# Patient Record
Sex: Female | Born: 1935 | Race: Black or African American | Hispanic: No | Marital: Single | Smoking: Never smoker
Health system: Southern US, Community
[De-identification: ages and names within clinical notes are randomized; demographics above are authoritative.]

## PROBLEM LIST (undated history)

## (undated) DIAGNOSIS — G40909 Epilepsy, unspecified, not intractable, without status epilepticus: Secondary | ICD-10-CM

## (undated) DIAGNOSIS — Z9221 Personal history of antineoplastic chemotherapy: Secondary | ICD-10-CM

## (undated) DIAGNOSIS — Z5181 Encounter for therapeutic drug level monitoring: Secondary | ICD-10-CM

## (undated) DIAGNOSIS — M1990 Unspecified osteoarthritis, unspecified site: Secondary | ICD-10-CM

## (undated) DIAGNOSIS — C50919 Malignant neoplasm of unspecified site of unspecified female breast: Secondary | ICD-10-CM

## (undated) DIAGNOSIS — Z923 Personal history of irradiation: Secondary | ICD-10-CM

## (undated) DIAGNOSIS — M8580 Other specified disorders of bone density and structure, unspecified site: Secondary | ICD-10-CM

## (undated) HISTORY — DX: Malignant neoplasm of unspecified site of unspecified female breast: C50.919

## (undated) HISTORY — DX: Unspecified osteoarthritis, unspecified site: M19.90

## (undated) HISTORY — DX: Encounter for therapeutic drug level monitoring: Z51.81

## (undated) HISTORY — DX: Epilepsy, unspecified, not intractable, without status epilepticus: G40.909

## (undated) HISTORY — DX: Other specified disorders of bone density and structure, unspecified site: M85.80

## (undated) HISTORY — PX: BREAST BIOPSY: SHX20

---

## 1998-10-28 DIAGNOSIS — C50919 Malignant neoplasm of unspecified site of unspecified female breast: Secondary | ICD-10-CM

## 1998-10-28 DIAGNOSIS — Z923 Personal history of irradiation: Secondary | ICD-10-CM

## 1998-10-28 DIAGNOSIS — Z9221 Personal history of antineoplastic chemotherapy: Secondary | ICD-10-CM

## 1998-10-28 HISTORY — DX: Personal history of antineoplastic chemotherapy: Z92.21

## 1998-10-28 HISTORY — DX: Personal history of irradiation: Z92.3

## 1998-10-28 HISTORY — DX: Malignant neoplasm of unspecified site of unspecified female breast: C50.919

## 1999-06-26 ENCOUNTER — Ambulatory Visit (HOSPITAL_COMMUNITY): Admission: RE | Admit: 1999-06-26 | Discharge: 1999-06-26 | Payer: Self-pay | Admitting: Obstetrics and Gynecology

## 1999-06-26 ENCOUNTER — Encounter: Payer: Self-pay | Admitting: Obstetrics and Gynecology

## 1999-07-03 ENCOUNTER — Encounter: Payer: Self-pay | Admitting: Obstetrics and Gynecology

## 1999-07-03 ENCOUNTER — Ambulatory Visit (HOSPITAL_COMMUNITY): Admission: RE | Admit: 1999-07-03 | Discharge: 1999-07-03 | Payer: Self-pay | Admitting: Obstetrics and Gynecology

## 1999-07-04 ENCOUNTER — Ambulatory Visit (HOSPITAL_COMMUNITY): Admission: RE | Admit: 1999-07-04 | Discharge: 1999-07-04 | Payer: Self-pay | Admitting: Obstetrics and Gynecology

## 1999-07-04 ENCOUNTER — Encounter: Payer: Self-pay | Admitting: Obstetrics and Gynecology

## 1999-07-04 HISTORY — PX: BREAST BIOPSY: SHX20

## 1999-07-25 ENCOUNTER — Encounter: Payer: Self-pay | Admitting: General Surgery

## 1999-07-25 ENCOUNTER — Ambulatory Visit (HOSPITAL_BASED_OUTPATIENT_CLINIC_OR_DEPARTMENT_OTHER): Admission: RE | Admit: 1999-07-25 | Discharge: 1999-07-25 | Payer: Self-pay | Admitting: General Surgery

## 1999-07-25 HISTORY — PX: BREAST LUMPECTOMY: SHX2

## 1999-08-06 ENCOUNTER — Encounter: Payer: Self-pay | Admitting: General Surgery

## 1999-08-07 ENCOUNTER — Ambulatory Visit (HOSPITAL_COMMUNITY): Admission: RE | Admit: 1999-08-07 | Discharge: 1999-08-07 | Payer: Self-pay | Admitting: General Surgery

## 1999-08-07 ENCOUNTER — Encounter: Payer: Self-pay | Admitting: General Surgery

## 1999-09-05 ENCOUNTER — Encounter: Payer: Self-pay | Admitting: General Surgery

## 1999-09-05 ENCOUNTER — Ambulatory Visit (HOSPITAL_COMMUNITY): Admission: RE | Admit: 1999-09-05 | Discharge: 1999-09-05 | Payer: Self-pay | Admitting: General Surgery

## 1999-11-14 ENCOUNTER — Ambulatory Visit (HOSPITAL_BASED_OUTPATIENT_CLINIC_OR_DEPARTMENT_OTHER): Admission: RE | Admit: 1999-11-14 | Discharge: 1999-11-14 | Payer: Self-pay | Admitting: General Surgery

## 1999-12-17 ENCOUNTER — Encounter: Admission: RE | Admit: 1999-12-17 | Discharge: 2000-03-16 | Payer: Self-pay | Admitting: Radiation Oncology

## 2000-08-30 ENCOUNTER — Emergency Department (HOSPITAL_COMMUNITY): Admission: EM | Admit: 2000-08-30 | Discharge: 2000-08-30 | Payer: Self-pay | Admitting: Emergency Medicine

## 2000-10-14 ENCOUNTER — Encounter: Admission: RE | Admit: 2000-10-14 | Discharge: 2000-10-14 | Payer: Self-pay | Admitting: General Surgery

## 2000-10-14 ENCOUNTER — Encounter: Payer: Self-pay | Admitting: General Surgery

## 2000-12-12 ENCOUNTER — Encounter: Admission: RE | Admit: 2000-12-12 | Discharge: 2001-03-12 | Payer: Self-pay | Admitting: Dentistry

## 2001-09-03 ENCOUNTER — Encounter (HOSPITAL_COMMUNITY): Admission: RE | Admit: 2001-09-03 | Discharge: 2001-12-02 | Payer: Self-pay | Admitting: Dentistry

## 2001-10-13 ENCOUNTER — Encounter: Admission: RE | Admit: 2001-10-13 | Discharge: 2001-10-13 | Payer: Self-pay | Admitting: Family Medicine

## 2001-10-13 ENCOUNTER — Encounter: Payer: Self-pay | Admitting: Family Medicine

## 2001-10-16 ENCOUNTER — Encounter: Payer: Self-pay | Admitting: General Surgery

## 2001-10-16 ENCOUNTER — Encounter: Admission: RE | Admit: 2001-10-16 | Discharge: 2001-10-16 | Payer: Self-pay | Admitting: General Surgery

## 2002-03-04 ENCOUNTER — Encounter (HOSPITAL_COMMUNITY): Admission: RE | Admit: 2002-03-04 | Discharge: 2002-06-02 | Payer: Self-pay | Admitting: Dentistry

## 2002-03-16 ENCOUNTER — Encounter: Payer: Self-pay | Admitting: Family Medicine

## 2002-03-16 ENCOUNTER — Encounter: Admission: RE | Admit: 2002-03-16 | Discharge: 2002-03-16 | Payer: Self-pay | Admitting: Family Medicine

## 2002-04-02 ENCOUNTER — Encounter: Payer: Self-pay | Admitting: Family Medicine

## 2002-04-02 ENCOUNTER — Encounter: Admission: RE | Admit: 2002-04-02 | Discharge: 2002-04-02 | Payer: Self-pay | Admitting: Family Medicine

## 2002-10-19 ENCOUNTER — Encounter: Payer: Self-pay | Admitting: General Surgery

## 2002-10-19 ENCOUNTER — Encounter: Admission: RE | Admit: 2002-10-19 | Discharge: 2002-10-19 | Payer: Self-pay | Admitting: General Surgery

## 2003-10-25 ENCOUNTER — Encounter: Admission: RE | Admit: 2003-10-25 | Discharge: 2003-10-25 | Payer: Self-pay | Admitting: Hematology & Oncology

## 2004-04-30 ENCOUNTER — Emergency Department (HOSPITAL_COMMUNITY): Admission: EM | Admit: 2004-04-30 | Discharge: 2004-04-30 | Payer: Self-pay | Admitting: Emergency Medicine

## 2004-10-04 ENCOUNTER — Ambulatory Visit: Payer: Self-pay | Admitting: Hematology & Oncology

## 2004-10-25 ENCOUNTER — Encounter: Admission: RE | Admit: 2004-10-25 | Discharge: 2004-10-25 | Payer: Self-pay | Admitting: General Surgery

## 2004-11-26 ENCOUNTER — Ambulatory Visit (HOSPITAL_COMMUNITY): Admission: RE | Admit: 2004-11-26 | Discharge: 2004-11-26 | Payer: Self-pay | Admitting: Gastroenterology

## 2004-12-03 ENCOUNTER — Encounter: Admission: RE | Admit: 2004-12-03 | Discharge: 2004-12-03 | Payer: Self-pay | Admitting: Dentistry

## 2004-12-03 ENCOUNTER — Ambulatory Visit: Payer: Self-pay | Admitting: Dentistry

## 2004-12-10 ENCOUNTER — Encounter: Admission: RE | Admit: 2004-12-10 | Discharge: 2004-12-10 | Payer: Self-pay | Admitting: Dentistry

## 2004-12-12 ENCOUNTER — Ambulatory Visit: Payer: Self-pay | Admitting: Dentistry

## 2005-08-13 ENCOUNTER — Encounter: Admission: RE | Admit: 2005-08-13 | Discharge: 2005-08-13 | Payer: Self-pay | Admitting: Neurology

## 2005-10-03 ENCOUNTER — Ambulatory Visit: Payer: Self-pay | Admitting: Hematology & Oncology

## 2005-11-04 ENCOUNTER — Encounter: Admission: RE | Admit: 2005-11-04 | Discharge: 2005-11-04 | Payer: Self-pay | Admitting: Hematology & Oncology

## 2006-09-29 ENCOUNTER — Ambulatory Visit: Payer: Self-pay | Admitting: Dentistry

## 2006-09-30 ENCOUNTER — Ambulatory Visit: Payer: Self-pay | Admitting: Hematology & Oncology

## 2006-10-03 LAB — CBC WITH DIFFERENTIAL/PLATELET
Basophils Absolute: 0 10*3/uL (ref 0.0–0.1)
Eosinophils Absolute: 0.2 10*3/uL (ref 0.0–0.5)
HGB: 13.7 g/dL (ref 11.6–15.9)
MONO#: 0.3 10*3/uL (ref 0.1–0.9)
NEUT#: 5.1 10*3/uL (ref 1.5–6.5)
RDW: 12.8 % (ref 11.3–14.5)
WBC: 7.2 10*3/uL (ref 3.9–10.0)
lymph#: 1.6 10*3/uL (ref 0.9–3.3)

## 2006-10-03 LAB — COMPREHENSIVE METABOLIC PANEL
Albumin: 3.9 g/dL (ref 3.5–5.2)
BUN: 11 mg/dL (ref 6–23)
Calcium: 8.9 mg/dL (ref 8.4–10.5)
Chloride: 104 mEq/L (ref 96–112)
Glucose, Bld: 164 mg/dL — ABNORMAL HIGH (ref 70–99)
Potassium: 3.9 mEq/L (ref 3.5–5.3)
Total Protein: 6.9 g/dL (ref 6.0–8.3)

## 2006-12-09 ENCOUNTER — Encounter: Admission: RE | Admit: 2006-12-09 | Discharge: 2006-12-09 | Payer: Self-pay | Admitting: Hematology & Oncology

## 2007-09-30 ENCOUNTER — Ambulatory Visit: Payer: Self-pay | Admitting: Hematology & Oncology

## 2007-10-28 LAB — CBC WITH DIFFERENTIAL/PLATELET
BASO%: 0.7 % (ref 0.0–2.0)
Basophils Absolute: 0.1 10*3/uL (ref 0.0–0.1)
EOS%: 2.5 % (ref 0.0–7.0)
Eosinophils Absolute: 0.2 10*3/uL (ref 0.0–0.5)
HCT: 39 % (ref 34.8–46.6)
HGB: 13.4 g/dL (ref 11.6–15.9)
LYMPH%: 24.3 % (ref 14.0–48.0)
MCH: 29.7 pg (ref 26.0–34.0)
MCHC: 34.4 g/dL (ref 32.0–36.0)
MCV: 86.4 fL (ref 81.0–101.0)
MONO#: 0.4 10*3/uL (ref 0.1–0.9)
MONO%: 4.9 % (ref 0.0–13.0)
NEUT#: 5.1 10*3/uL (ref 1.5–6.5)
NEUT%: 67.6 % (ref 39.6–76.8)
Platelets: 279 10*3/uL (ref 145–400)
RBC: 4.51 10*6/uL (ref 3.70–5.32)
RDW: 12.9 % (ref 11.3–14.5)
WBC: 7.5 10*3/uL (ref 3.9–10.0)
lymph#: 1.8 10*3/uL (ref 0.9–3.3)

## 2007-10-28 LAB — COMPREHENSIVE METABOLIC PANEL
ALT: 18 U/L (ref 0–35)
AST: 18 U/L (ref 0–37)
Albumin: 3.9 g/dL (ref 3.5–5.2)
Alkaline Phosphatase: 66 U/L (ref 39–117)
BUN: 12 mg/dL (ref 6–23)
CO2: 25 mEq/L (ref 19–32)
Calcium: 9.2 mg/dL (ref 8.4–10.5)
Chloride: 107 mEq/L (ref 96–112)
Creatinine, Ser: 0.78 mg/dL (ref 0.40–1.20)
Glucose, Bld: 115 mg/dL — ABNORMAL HIGH (ref 70–99)
Potassium: 4 mEq/L (ref 3.5–5.3)
Sodium: 144 mEq/L (ref 135–145)
Total Bilirubin: 0.2 mg/dL — ABNORMAL LOW (ref 0.3–1.2)
Total Protein: 7.1 g/dL (ref 6.0–8.3)

## 2007-12-11 ENCOUNTER — Encounter: Admission: RE | Admit: 2007-12-11 | Discharge: 2007-12-11 | Payer: Self-pay | Admitting: Obstetrics and Gynecology

## 2008-10-20 ENCOUNTER — Ambulatory Visit: Payer: Self-pay | Admitting: Hematology & Oncology

## 2008-10-24 LAB — CBC WITH DIFFERENTIAL (CANCER CENTER ONLY)
BASO%: 0.5 % (ref 0.0–2.0)
EOS%: 3.1 % (ref 0.0–7.0)
LYMPH%: 20.6 % (ref 14.0–48.0)
MCH: 29.6 pg (ref 26.0–34.0)
MCHC: 34 g/dL (ref 32.0–36.0)
MCV: 87 fL (ref 81–101)
MONO%: 6.9 % (ref 0.0–13.0)
NEUT#: 4.4 10*3/uL (ref 1.5–6.5)
Platelets: 248 10*3/uL (ref 145–400)
RBC: 4.55 10*6/uL (ref 3.70–5.32)
RDW: 11.6 % (ref 10.5–14.6)

## 2008-10-24 LAB — COMPREHENSIVE METABOLIC PANEL
Albumin: 3.9 g/dL (ref 3.5–5.2)
Alkaline Phosphatase: 75 U/L (ref 39–117)
BUN: 11 mg/dL (ref 6–23)
CO2: 28 mEq/L (ref 19–32)
Calcium: 8.7 mg/dL (ref 8.4–10.5)
Sodium: 140 mEq/L (ref 135–145)

## 2008-12-12 ENCOUNTER — Encounter: Admission: RE | Admit: 2008-12-12 | Discharge: 2008-12-12 | Payer: Self-pay | Admitting: Hematology & Oncology

## 2009-10-24 ENCOUNTER — Ambulatory Visit: Payer: Self-pay | Admitting: Hematology & Oncology

## 2009-10-24 ENCOUNTER — Encounter: Admission: RE | Admit: 2009-10-24 | Discharge: 2009-10-24 | Payer: Self-pay | Admitting: Neurology

## 2009-10-28 DIAGNOSIS — M8580 Other specified disorders of bone density and structure, unspecified site: Secondary | ICD-10-CM

## 2009-10-28 HISTORY — DX: Other specified disorders of bone density and structure, unspecified site: M85.80

## 2009-11-09 ENCOUNTER — Encounter: Admission: RE | Admit: 2009-11-09 | Discharge: 2009-11-09 | Payer: Self-pay | Admitting: Neurology

## 2009-12-21 ENCOUNTER — Encounter: Admission: RE | Admit: 2009-12-21 | Discharge: 2009-12-21 | Payer: Self-pay | Admitting: Hematology & Oncology

## 2010-10-24 ENCOUNTER — Ambulatory Visit: Payer: Self-pay | Admitting: Hematology & Oncology

## 2010-11-17 ENCOUNTER — Other Ambulatory Visit: Payer: Self-pay | Admitting: Hematology & Oncology

## 2010-11-17 DIAGNOSIS — Z1239 Encounter for other screening for malignant neoplasm of breast: Secondary | ICD-10-CM

## 2010-12-24 ENCOUNTER — Ambulatory Visit: Payer: Self-pay

## 2010-12-25 ENCOUNTER — Ambulatory Visit
Admission: RE | Admit: 2010-12-25 | Discharge: 2010-12-25 | Disposition: A | Discharge status: Home / Self Care | Payer: Medicare Other | Source: Ambulatory Visit | Attending: Hematology & Oncology | Admitting: Hematology & Oncology

## 2010-12-25 DIAGNOSIS — Z1239 Encounter for other screening for malignant neoplasm of breast: Secondary | ICD-10-CM

## 2010-12-26 ENCOUNTER — Ambulatory Visit: Payer: Self-pay

## 2010-12-28 ENCOUNTER — Other Ambulatory Visit: Payer: Self-pay | Admitting: Hematology & Oncology

## 2010-12-28 DIAGNOSIS — R928 Other abnormal and inconclusive findings on diagnostic imaging of breast: Secondary | ICD-10-CM

## 2011-01-03 ENCOUNTER — Ambulatory Visit
Admission: RE | Admit: 2011-01-03 | Discharge: 2011-01-03 | Disposition: A | Discharge status: Home / Self Care | Payer: Medicare Other | Source: Ambulatory Visit | Attending: Hematology & Oncology | Admitting: Hematology & Oncology

## 2011-01-03 DIAGNOSIS — R928 Other abnormal and inconclusive findings on diagnostic imaging of breast: Secondary | ICD-10-CM

## 2011-03-15 NOTE — Op Note (Signed)
Brandi [name], Brandi [name]                 ACCOUNT NO.:  [id number]   MEDICAL RECORD NO.:  [id number]          PATIENT TYPE:  AMB   LOCATION:  ENDO                         FACILITY:  [facility]   PHYSICIAN:  Petra Kuba, M.D.    DATE OF BIRTH:  [birth year]   DATE OF PROCEDURE:  11/26/2004  DATE OF DISCHARGE:                                 OPERATIVE REPORT   PROCEDURE:  Colonoscopy.   INDICATIONS:  History of breast cancer, due for colonic screening.  Consent  was signed after risks, benefits, methods and options were thoroughly  discussed in the office.   MEDICATIONS USED:  1.  Phenergan 25  mg .  2.  Versed 5 mg.   DESCRIPTION OF PROCEDURE:  Rectal inspection is pertinent for small external  hemorrhoids.  Digital examination was negative.  Video pediatric adjustable  colonoscope was inserted and with rolling her onto her back and abdominal  pressure we were able to advance to the cecum.  On insertion a mid  transverse, small lipoma was seen; it was not biopsied based on the classic  appearance. Had a nice yellow hue, with no overlying mucosa.  The cecum was  identified by the appendiceal orifice and the ileocecal valve.  There was  some vegetable matter in the right side of the colon that could be washed in  different parts, but could not be sectioned.  The rest of the prep was  adequate.  There was some washing and suctioning needed for liquid stool on  slow withdrawal through the colon.  Other than some rare left-sided  diverticula, no other abnormalities were seen.  Anorectal pullthrough in  retroflexion confirmed some small hemorrhoids.  The scope was straightened  and readvanced a short way up the left side of the colon.  Air was suctioned  and scope removed.  The patient tolerated the procedure well; there was no  obvious immediate complication.   ENDOSCOPIC DIAGNOSES:  1.  Internal/external small hemorrhoids.  2.  Rare left-sided diverticula.  3.  Small mid-transverse lipoma;  plus appearance not biopsied.  4.  Otherwise within normal limits to the cecum.   PLAN:  Happy to see back p.r.n.  Probably yearly rectals and guaiacs per  gynecology, oncology or primary care physician.  Otherwise, repeat colon  screening in five years.      MEM/MEDQ  D:  11/26/2004  T:  11/26/2004  Job:  [id number]   cc:   Lorne Skeens. Hoxworth, M.D.  [address]  [place]  [place] [address]

## 2011-11-01 ENCOUNTER — Other Ambulatory Visit: Payer: Self-pay | Admitting: Hematology & Oncology

## 2011-11-01 ENCOUNTER — Ambulatory Visit: Payer: Medicare Other | Admitting: Family

## 2011-11-01 ENCOUNTER — Other Ambulatory Visit: Payer: Medicare Other | Admitting: Lab

## 2011-11-01 ENCOUNTER — Ambulatory Visit (HOSPITAL_BASED_OUTPATIENT_CLINIC_OR_DEPARTMENT_OTHER): Payer: Medicare Other | Admitting: Hematology & Oncology

## 2011-11-01 ENCOUNTER — Other Ambulatory Visit (HOSPITAL_BASED_OUTPATIENT_CLINIC_OR_DEPARTMENT_OTHER): Payer: Medicare Other | Admitting: Lab

## 2011-11-01 DIAGNOSIS — Z853 Personal history of malignant neoplasm of breast: Secondary | ICD-10-CM

## 2011-11-01 DIAGNOSIS — M810 Age-related osteoporosis without current pathological fracture: Secondary | ICD-10-CM

## 2011-11-01 DIAGNOSIS — C50919 Malignant neoplasm of unspecified site of unspecified female breast: Secondary | ICD-10-CM

## 2011-11-01 NOTE — Progress Notes (Signed)
This office note has been dictated.

## 2011-11-02 LAB — VITAMIN D 25 HYDROXY (VIT D DEFICIENCY, FRACTURES): Vit D, 25-Hydroxy: 44 ng/mL (ref 30–89)

## 2011-11-04 NOTE — Progress Notes (Signed)
CC:   Katherine Roan, M.D. [facility] Genene Churn. Love, M.D.  DIAGNOSIS:  Stage IIA (T1 N1 M0) ductal carcinoma of the left breast.  CURRENT THERAPY:  Observation.  INTERIM HISTORY:  Ms. Mealy comes in for followup.  We see her yearly. Otherwise, she is full of stories.  She is doing well.  She has had no problems since we last saw her.  She had her last mammogram back in March of 2012.  The mammogram came out okay with no evidence of any suspicion of calcifications.  She has had no cough.  There has been no bony pain.  She has had no menopausal symptoms.  Her appetite is good.  She has had no change in bowel or bladder habits.  She has had no problems with headache.  There is no double vision or blurred vision.  PHYSICAL EXAMINATION:  This is a well-developed, well-nourished white female in no obvious distress.  Vital signs show a temperature of 97.4, pulse 77, respiratory rate 18, blood pressure 96/52.  Weight is 134. Head and neck:  Shows a normocephalic, atraumatic skull.  There are no ocular or oral lesions.  There are no palpable cervical or supraclavicular lymph nodes.  Lungs:  Clear bilaterally.  Cardiac: Regular rate and rhythm with a normal S1 and S2.  No murmurs, rubs or bruits.  Abdomen:  Soft with good bowel sounds.  There is no palpable abdominal mass.  There is no fluid wave.  There is no palpable hepatosplenomegaly.  Breasts:  Right breast with no masses, edema or erythema.  There is no right breast mass.  There is no right axillary adenopathy.  Left breast shows a well-healed lumpectomy scar at the 9 o'clock.  There may be some slight contraction of the left breast.  She has no obvious mass in the left breast.  There is no left axillary adenopathy.  Back:  No tenderness of the spine, ribs, or hips.  There is no kyphosis or osteoporotic changes.  Extremities:  No clubbing, cyanosis or edema.  Neurologic:  No focal neurological deficits.  Skin: No  rashes, ecchymoses or petechia.  LABORATORY STUDIES:  Not done this visit.  IMPRESSION:  Ms. Profitt is a 76 year old African American female with a history of stage IIA (T1 N1 M0) ductal carcinoma of the left breast. She underwent a lumpectomy.  She had adjuvant chemotherapy with 4 of epirubicin/Cytoxan.  She had ONE positive lymph node.  She did have radiation therapy.  Her tumor was ESTROGEN RECEPTOR/PROGESTERONE RECEPTOR-POSITIVE.  She was on tamoxifen.  Again, I see no evidence of recurrence to date.  We will plan to get her back in 1 year.    ______________________________ Josph Macho, M.D. PRE/MEDQ  D:  11/01/2011  T:  11/01/2011  Job:  [id number]

## 2012-01-13 ENCOUNTER — Other Ambulatory Visit: Payer: Self-pay | Admitting: Family Medicine

## 2012-01-13 DIAGNOSIS — Z1231 Encounter for screening mammogram for malignant neoplasm of breast: Secondary | ICD-10-CM

## 2012-01-20 ENCOUNTER — Ambulatory Visit
Admission: RE | Admit: 2012-01-20 | Discharge: 2012-01-20 | Disposition: A | Discharge status: Home / Self Care | Payer: Medicare Other | Source: Ambulatory Visit | Attending: Family Medicine | Admitting: Family Medicine

## 2012-01-20 DIAGNOSIS — Z1231 Encounter for screening mammogram for malignant neoplasm of breast: Secondary | ICD-10-CM

## 2012-04-07 ENCOUNTER — Emergency Department (HOSPITAL_COMMUNITY)
Admission: EM | Admit: 2012-04-07 | Discharge: 2012-04-07 | Disposition: A | Discharge status: Home Health | Payer: Medicare Other | Attending: Emergency Medicine | Admitting: Emergency Medicine

## 2012-04-07 ENCOUNTER — Emergency Department (HOSPITAL_COMMUNITY): Payer: Medicare Other

## 2012-04-07 ENCOUNTER — Encounter (HOSPITAL_COMMUNITY): Payer: Self-pay | Admitting: Emergency Medicine

## 2012-04-07 DIAGNOSIS — M1710 Unilateral primary osteoarthritis, unspecified knee: Secondary | ICD-10-CM | POA: Insufficient documentation

## 2012-04-07 DIAGNOSIS — M25569 Pain in unspecified knee: Secondary | ICD-10-CM

## 2012-04-07 MED ORDER — IBUPROFEN 200 MG PO TABS
400.0000 mg | ORAL_TABLET | Freq: Once | ORAL | Status: AC
  Administered 2012-04-07: 400 mg via ORAL
  Filled 2012-04-07: qty 2

## 2012-04-07 NOTE — ED Provider Notes (Signed)
History    This chart was scribed for Brandi Roots, MD, MD by Smitty Pluck. The patient was seen in room STRE4 and the patient's care was started at 11:11AM.   CSN: [id number]  Arrival date & time 04/07/12  1017   First MD Initiated Contact with Patient 04/07/12 1107      Chief Complaint  Patient presents with  . Knee Pain    (Consider location/radiation/quality/duration/timing/severity/associated sxs/prior treatment) HPI Brandi [name] is a 76 y.o. female who presents to the Emergency Department complaining of moderate knee pain onset 1 week ago after fall on cement. States tripped/mechanical fall, no faintness or dizziness. . Pt reports having more pain in right knee. The left knee pain has resolved.  She states that there was bruising and swelling. The pain has been constant since onset without radiation. Denies numbness and weakness in the leg. Walking aggravates the pain. Denies LOC and hitting head during fall.  Denies other injury. No calf pain or swelling. No numbness/weakness. No neck or back pain.    Past Medical History  Diagnosis Date  . Seizures     History reviewed. No pertinent past surgical history.  History reviewed. No pertinent family history.  History  Substance Use Topics  . Smoking status: Never Smoker   . Smokeless tobacco: Not on file  . Alcohol Use: No    OB History    Grav Para Term Preterm Abortions TAB SAB Ect Mult Living                  Review of Systems  Constitutional: Negative for fever and chills.  Respiratory: Negative for cough and shortness of breath.   Gastrointestinal: Negative for nausea, vomiting and diarrhea.  Musculoskeletal: Negative for back pain.    Allergies  Tegretol  Home Medications   Current Outpatient Rx  Name Route Sig Dispense Refill  . ASPIRIN 81 MG PO TABS Oral Take 81 mg by mouth daily.    Marland Kitchen CALCIUM PO Oral Take 1 tablet by mouth daily.    Marland Kitchen DILANTIN 100 MG PO CAPS Oral Take 200-300 mg by mouth daily.  Monday, Wednesday, Friday 300mg  then rest of the week takes 200mg     . MULTIVITAMINS PO CAPS Oral Take 1 capsule by mouth daily.        BP 143/65  Pulse 98  Temp(Src) 98.2 F (36.8 C) (Oral)  Resp 20  SpO2 99%  Physical Exam  Nursing note and vitals reviewed. Constitutional: She is oriented to person, place, and time. She appears well-developed and well-nourished. No distress.  HENT:  Head: Normocephalic and atraumatic.  Eyes: Pupils are equal, round, and reactive to light.  Neck: Normal range of motion. Neck supple. No tracheal deviation present.  Cardiovascular: Normal rate.   Pulmonary/Chest: Effort normal. No respiratory distress.  Abdominal: She exhibits no distension.  Musculoskeletal: Normal range of motion. She exhibits tenderness (bilateral knees).       ctls spine non tender, aligned, no step off.  Good rom bil hips, knee and ankles. Dp/pt 2+ bil. bil knees stable. No effusions. Tenderness right knee anteriorly. No focal tenderness on left. No calf pain, swelling or tenderness. Skin normal color and warmth, no erythema. No skin lesions.   Neurological: She is alert and oriented to person, place, and time.  Skin: Skin is warm and dry.  Psychiatric: She has a normal mood and affect. Her behavior is normal.    ED Course  Procedures (including critical care time) DIAGNOSTIC STUDIES:  Oxygen Saturation is 99% on room air, normal by my interpretation.    COORDINATION OF CARE: 11:13AM EDP discusses pt ED treatment with pt.   Dg Knee Complete 4 Views Right  04/07/2012  *RADIOLOGY REPORT*  Clinical Data: Fall.  Knee pain and swelling.  RIGHT KNEE - COMPLETE 4+ VIEW  Comparison: None.  Findings: No evidence of fracture or dislocation.  No evidence of knee joint effusion.  Mild tricompartmental osteoarthritis shows no significant change.  No other bone abnormality identified.  IMPRESSION:  1.  No acute findings. 2.  Mild tricompartmental osteoarthritis.  Original Report  Authenticated By: Danae Orleans, M.D.       MDM  I personally performed the services described in this documentation, which was scribed in my presence. The recorded information has been reviewed and considered. Brandi Roots, MD  Motrin po. Xrays.        Brandi Roots, MD 04/07/12 1200

## 2012-04-07 NOTE — ED Notes (Signed)
Right knee pain and swelling post fall x 1 week, patient with +pms in bilateral lower extremities

## 2012-04-07 NOTE — Discharge Instructions (Signed)
Your knee xrays show arthritic/degerative changes, but no acute fracture. Take motrin or aleve as need for pain. Follow up with primary care doctor in 1 week if symptoms fail to improve/resolve. Return to ER if worse, new symptoms, other concern.      Knee Pain The knee is the complex joint between your thigh and your lower leg. It is made up of bones, tendons, ligaments, and cartilage. The bones that make up the knee are:  The femur in the thigh.   The tibia and fibula in the lower leg.   The patella or kneecap riding in the groove on the lower femur.  CAUSES  Knee pain is a common complaint with many causes. A few of these causes are:  Injury, such as:   A ruptured ligament or tendon injury.   Torn cartilage.   Medical conditions, such as:   Gout   Arthritis   Infections   Overuse, over training or overdoing a physical activity.  Knee pain can be minor or severe. Knee pain can accompany debilitating injury. Minor knee problems often respond well to self-care measures or get well on their own. More serious injuries may need medical intervention or even surgery. SYMPTOMS The knee is complex. Symptoms of knee problems can vary widely. Some of the problems are:  Pain with movement and weight bearing.   Swelling and tenderness.   Buckling of the knee.   Inability to straighten or extend your knee.   Your knee locks and you cannot straighten it.   Warmth and redness with pain and fever.   Deformity or dislocation of the kneecap.  DIAGNOSIS  Determining what is wrong may be very straight forward such as when there is an injury. It can also be challenging because of the complexity of the knee. Tests to make a diagnosis may include:  Your caregiver taking a history and doing a physical exam.   Routine X-rays can be used to rule out other problems. X-rays will not reveal a cartilage tear. Some injuries of the knee can be diagnosed by:   Arthroscopy a surgical  technique by which a small video camera is inserted through tiny incisions on the sides of the knee. This procedure is used to examine and repair internal knee joint problems. Tiny instruments can be used during arthroscopy to repair the torn knee cartilage (meniscus).   Arthrography is a radiology technique. A contrast liquid is directly injected into the knee joint. Internal structures of the knee joint then become visible on X-ray film.   An MRI scan is a non x-ray radiology procedure in which magnetic fields and a computer produce two- or three-dimensional images of the inside of the knee. Cartilage tears are often visible using an MRI scanner. MRI scans have largely replaced arthrography in diagnosing cartilage tears of the knee.   Blood work.   Examination of the fluid that helps to lubricate the knee joint (synovial fluid). This is done by taking a sample out using a needle and a syringe.  TREATMENT The treatment of knee problems depends on the cause. Some of these treatments are:  Depending on the injury, proper casting, splinting, surgery or physical therapy care will be needed.   Give yourself adequate recovery time. Do not overuse your joints. If you begin to get sore during workout routines, back off. Slow down or do fewer repetitions.   For repetitive activities such as cycling or running, maintain your strength and nutrition.   Alternate muscle groups. For  example if you are a weight lifter, work the upper body on one day and the lower body the next.   Either tight or weak muscles do not give the proper support for your knee. Tight or weak muscles do not absorb the stress placed on the knee joint. Keep the muscles surrounding the knee strong.   Take care of mechanical problems.   If you have flat feet, orthotics or special shoes may help. See your caregiver if you need help.   Arch supports, sometimes with wedges on the inner or outer aspect of the heel, can help. These can  shift pressure away from the side of the knee most bothered by osteoarthritis.   A brace called an "unloader" brace also may be used to help ease the pressure on the most arthritic side of the knee.   If your caregiver has prescribed crutches, braces, wraps or ice, use as directed. The acronym for this is PRICE. This means protection, rest, ice, compression and elevation.   Nonsteroidal anti-inflammatory drugs (NSAID's), can help relieve pain. But if taken immediately after an injury, they may actually increase swelling. Take NSAID's with food in your stomach. Stop them if you develop stomach problems. Do not take these if you have a history of ulcers, stomach pain or bleeding from the bowel. Do not take without your caregiver's approval if you have problems with fluid retention, heart failure, or kidney problems.   For ongoing knee problems, physical therapy may be helpful.   Glucosamine and chondroitin are over-the-counter dietary supplements. Both may help relieve the pain of osteoarthritis in the knee. These medicines are different from the usual anti-inflammatory drugs. Glucosamine may decrease the rate of cartilage destruction.   Injections of a corticosteroid drug into your knee joint may help reduce the symptoms of an arthritis flare-up. They may provide pain relief that lasts a few months. You may have to wait a few months between injections. The injections do have a small increased risk of infection, water retention and elevated blood sugar levels.   Hyaluronic acid injected into damaged joints may ease pain and provide lubrication. These injections may work by reducing inflammation. A series of shots may give relief for as long as 6 months.   Topical painkillers. Applying certain ointments to your skin may help relieve the pain and stiffness of osteoarthritis. Ask your pharmacist for suggestions. Many over the-counter products are approved for temporary relief of arthritis pain.   In  some countries, doctors often prescribe topical NSAID's for relief of chronic conditions such as arthritis and tendinitis. A review of treatment with NSAID creams found that they worked as well as oral medications but without the serious side effects.  PREVENTION  Maintain a healthy weight. Extra pounds put more strain on your joints.   Get strong, stay limber. Weak muscles are a common cause of knee injuries. Stretching is important. Include flexibility exercises in your workouts.   Be smart about exercise. If you have osteoarthritis, chronic knee pain or recurring injuries, you may need to change the way you exercise. This does not mean you have to stop being active. If your knees ache after jogging or playing basketball, consider switching to swimming, water aerobics or other low-impact activities, at least for a few days a week. Sometimes limiting high-impact activities will provide relief.   Make sure your shoes fit well. Choose footwear that is right for your sport.   Protect your knees. Use the proper gear for knee-sensitive activities. Use  kneepads when playing volleyball or laying carpet. Buckle your seat belt every time you drive. Most shattered kneecaps occur in car accidents.   Rest when you are tired.  SEEK MEDICAL CARE IF:  You have knee pain that is continual and does not seem to be getting better.  SEEK IMMEDIATE MEDICAL CARE IF:  Your knee joint feels hot to the touch and you have a high fever. MAKE SURE YOU:   Understand these instructions.   Will watch your condition.   Will get help right away if you are not doing well or get worse.  Document Released: 08/11/2007 Document Revised: 10/03/2011 Document Reviewed: 08/11/2007 [facility] Patient Information 2012 [place], [place].      Contusion A contusion is a deep bruise. Contusions are the result of an injury that caused bleeding under the skin. The contusion may turn blue, purple, or yellow. Minor injuries will give  you a painless contusion, but more severe contusions may stay painful and swollen for a few weeks.  CAUSES  A contusion is usually caused by a blow, trauma, or direct force to an area of the body. SYMPTOMS   Swelling and redness of the injured area.   Bruising of the injured area.   Tenderness and soreness of the injured area.   Pain.  DIAGNOSIS  The diagnosis can be made by taking a history and physical exam. An X-ray, CT scan, or MRI may be needed to determine if there were any associated injuries, such as fractures. TREATMENT  Specific treatment will depend on what area of the body was injured. In general, the best treatment for a contusion is resting, icing, elevating, and applying cold compresses to the injured area. Over-the-counter medicines may also be recommended for pain control. Ask your caregiver what the best treatment is for your contusion. HOME CARE INSTRUCTIONS   Put ice on the injured area.   Put ice in a plastic bag.   Place a towel between your skin and the bag.   Leave the ice on for 15 to 20 minutes, 3 to 4 times a day.   Only take over-the-counter or prescription medicines for pain, discomfort, or fever as directed by your caregiver. Your caregiver may recommend avoiding anti-inflammatory medicines (aspirin, ibuprofen, and naproxen) for 48 hours because these medicines may increase bruising.   Rest the injured area.   If possible, elevate the injured area to reduce swelling.  SEEK IMMEDIATE MEDICAL CARE IF:   You have increased bruising or swelling.   You have pain that is getting worse.   Your swelling or pain is not relieved with medicines.  MAKE SURE YOU:   Understand these instructions.   Will watch your condition.   Will get help right away if you are not doing well or get worse.  Document Released: 07/24/2005 Document Revised: 10/03/2011 Document Reviewed: 08/19/2011 [facility] Patient Information 2012 [place], [place].

## 2012-04-07 NOTE — ED Notes (Signed)
Pt sts feel last week and had bilateral knee pain and swelling; pt sts left knee has improved but right knee still swollen and painful

## 2012-05-07 ENCOUNTER — Telehealth: Payer: Self-pay | Admitting: Hematology & Oncology

## 2012-05-07 NOTE — Telephone Encounter (Signed)
Pt called wants to switch MD's left Misty Stanley detailed message

## 2012-05-12 ENCOUNTER — Telehealth: Payer: Self-pay | Admitting: *Deleted

## 2012-05-12 NOTE — Telephone Encounter (Signed)
Confirmed 05/21/12 appt w/ pt.  Mailed before appt letter & packet to pt.  Took paperwork to Med Rec for chart.

## 2012-05-21 ENCOUNTER — Ambulatory Visit: Payer: Medicare Other

## 2012-05-21 ENCOUNTER — Encounter: Payer: Self-pay | Admitting: Oncology

## 2012-05-21 ENCOUNTER — Other Ambulatory Visit: Payer: Self-pay | Admitting: *Deleted

## 2012-05-21 ENCOUNTER — Telehealth: Payer: Self-pay | Admitting: Oncology

## 2012-05-21 ENCOUNTER — Ambulatory Visit (HOSPITAL_BASED_OUTPATIENT_CLINIC_OR_DEPARTMENT_OTHER): Payer: Medicare Other | Admitting: Oncology

## 2012-05-21 ENCOUNTER — Other Ambulatory Visit (HOSPITAL_BASED_OUTPATIENT_CLINIC_OR_DEPARTMENT_OTHER): Payer: Medicare Other

## 2012-05-21 VITALS — BP 139/82 | HR 89 | Temp 98.3°F | Ht 62.0 in | Wt 133.7 lb

## 2012-05-21 DIAGNOSIS — C50919 Malignant neoplasm of unspecified site of unspecified female breast: Secondary | ICD-10-CM | POA: Insufficient documentation

## 2012-05-21 LAB — CBC WITH DIFFERENTIAL/PLATELET
BASO%: 1.4 % (ref 0.0–2.0)
EOS%: 3.5 % (ref 0.0–7.0)
MCH: 29.2 pg (ref 25.1–34.0)
MCHC: 32.5 g/dL (ref 31.5–36.0)
MONO#: 0.7 10*3/uL (ref 0.1–0.9)
NEUT%: 57 % (ref 38.4–76.8)
RBC: 4.5 10*6/uL (ref 3.70–5.45)
WBC: 8.3 10*3/uL (ref 3.9–10.3)
lymph#: 2.5 10*3/uL (ref 0.9–3.3)

## 2012-05-21 LAB — COMPREHENSIVE METABOLIC PANEL
ALT: 18 U/L (ref 0–35)
AST: 18 U/L (ref 0–37)
CO2: 28 mEq/L (ref 19–32)
Calcium: 9.4 mg/dL (ref 8.4–10.5)
Chloride: 100 mEq/L (ref 96–112)
Creatinine, Ser: 0.72 mg/dL (ref 0.50–1.10)
Sodium: 138 mEq/L (ref 135–145)
Total Bilirubin: 0.1 mg/dL — ABNORMAL LOW (ref 0.3–1.2)
Total Protein: 7.7 g/dL (ref 6.0–8.3)

## 2012-05-21 LAB — CANCER ANTIGEN 27.29: CA 27.29: 23 U/mL (ref 0–39)

## 2012-05-21 NOTE — Telephone Encounter (Signed)
gve the pt her April 2014 appt calendar °

## 2012-05-21 NOTE — Progress Notes (Signed)
ID: Brandi [name]   DOB: 12/05/1935  MR#: [id number]  CSN#:[id number]   INTERVAL HISTORY: Brandi [name] is a 76 year old woman recently moved to the [facility] area, formerly followed by Dr. Rosemarie Beath, but transferring her care here today because "I can't drive that far anymore". Her breast cancer history is summarized below.  REVIEW OF SYSTEMS: Brandi [name] generally feels well. Recently she fell and hurt her knees. 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 She takes her dog for walks. She is followed by neurology for a history of seizures, but tells me she has not had any seizures in many years. A detailed review of systems was otherwise entirely negative.  PAST MEDICAL HISTORY: Past Medical History  Diagnosis Date  . Seizures   . Breast cancer     PAST SURGICAL HISTORY: Past Surgical History  Procedure Date  . Mastectomy partial / lumpectomy     FAMILY HISTORY The patient's father died at the age of 57 from "natural causes". The patient's mother died at the age of 14, from unclear causes. The patient had 2 brothers, one died from lung cancer in the setting of tobacco abuse; the other one "up and died" at age 56. There is no history of breast cancer in the family to the patient's knowledge.  GYNECOLOGIC HISTORY: Menarche age 44, she is GX P0. She does not recall when she went through menopause. She never took hormone replacement.  SOCIAL HISTORY: She used to work as a Company secretary, but is now retired. She lives by herself, with her dog Brandi [name]. Her adopted daughter, Brandi [name], lives in [place] and works in Presenter, broadcasting for [employer]    ADVANCED DIRECTIVES: The patient's daughter Brandi [name] is healthcare power of attorney. She can be reached through her cell #[phone], or at home, at [phone]).  HEALTH MAINTENANCE: History  Substance Use Topics  . Smoking status: Never Smoker   . Smokeless  tobacco: Not on file  . Alcohol Use: No     Colonoscopy: Feb 2011/ [facility]  PAP: Per Dr. Uvaldo Rising  Bone density: January 2011, showed a T score of -2.3 at the lumbar spine  Lipid panel: Per Dr. Parke Simmers  Allergies  Allergen Reactions  . Tegretol (Carbamazepine) Itching    Current Outpatient Prescriptions  Medication Sig Dispense Refill  . CALCIUM PO Take 1 tablet by mouth daily.      Marland Kitchen DILANTIN 100 MG ER capsule Take 200-300 mg by mouth daily. Monday, Wednesday, Friday 300mg  then rest of the week takes 200mg       . fish oil-omega-3 fatty acids 1000 MG capsule Take 1 g by mouth daily.      . Multiple Vitamin (MULTIVITAMIN) capsule Take 1 capsule by mouth daily.        Marland Kitchen aspirin 81 MG tablet Take 81 mg by mouth daily.        OBJECTIVE: Elderly African American woman who appears well Filed Vitals:   05/21/12 1627  BP: 139/82  Pulse: 89  Temp: 98.3 F (36.8 C)     Body mass index is 24.45 kg/(m^2).    ECOG FS: 0  Sclerae unicteric Oropharynx clear No cervical or supraclavicular adenopathy Lungs no rales or rhonchi Heart regular rate and rhythm Abd benign MSK no focal spinal tenderness, no peripheral edema Neuro: nonfocal Breasts: The right breast is unremarkable; the left breast is status post lumpectomy and radiation; there  is no evidence of local recurrence.  LAB RESULTS: Lab Results  Component Value Date   WBC 8.3 05/21/2012   NEUTROABS 4.7 05/21/2012   HGB 13.1 05/21/2012   HCT 40.3 05/21/2012   MCV 89.7 05/21/2012   PLT 241 05/21/2012      Chemistry      Component Value Date/Time   NA 138 05/21/2012 1610   K 3.5 05/21/2012 1610   CL 100 05/21/2012 1610   CO2 28 05/21/2012 1610   BUN 15 05/21/2012 1610   CREATININE 0.72 05/21/2012 1610      Component Value Date/Time   CALCIUM 9.4 05/21/2012 1610   ALKPHOS 89 05/21/2012 1610   AST 18 05/21/2012 1610   ALT 18 05/21/2012 1610   BILITOT 0.1* 05/21/2012 1610       No results found for this basename: LABCA2    No  components found with this basename: LABCA125    No results found for this basename: INR:1;PROTIME:1 in the last 168 hours  Urinalysis No results found for this basename: colorurine,  appearanceur,  labspec,  phurine,  glucoseu,  hgbur,  bilirubinur,  ketonesur,  proteinur,  urobilinogen,  nitrite,  leukocytesur    STUDIES: Most recent mammogram 01/20/2012 was unremarkable.  ASSESSMENT: 76 y.o.  woman, status post left lumpectomy and axillary lymph node sampling October of 2000 for a T1c N1(mic), stage IB invasive ductal carcinoma, grade 2, estrogen receptor 98% positive, progesterone receptor 93% positive, with no HER-2 amplification, and an MIB-1 of 28%; status post 4 cycles of  cyclophosphamide/ epirubicin, followed by radiation, followed by raloxifine.  PLAN: There is no evidence of disease recurrence now 13 years out from her original surgery. I explained to Brandi [name] that I would be comfortable releasing her to her primary care physician, but for her it is reassuring to be seen by cancer Dr. once a year and we can certainly accommodate that. Since she usually gets mammography in March, she will see my physician's assistant next April. We will do lab work before that visit and make sure her primary care physician gets a copy. Otherwise are knows to call for any problems that may develop before the next visit.   Brandi [name] C    05/21/2012

## 2012-05-22 ENCOUNTER — Encounter: Payer: Self-pay | Admitting: Oncology

## 2012-05-22 NOTE — Progress Notes (Signed)
Patient came in on yesterday afternoon as a new patient and she has two insurance,and also she was seening Dr.Ennever so she just switch to Dr.Magrinat,but she said that she was [place] far [place] financial assistance.

## 2012-05-26 ENCOUNTER — Encounter: Payer: Self-pay | Admitting: *Deleted

## 2012-05-26 NOTE — Progress Notes (Signed)
Mailed after appt letter to pt. 

## 2012-06-24 ENCOUNTER — Ambulatory Visit (INDEPENDENT_AMBULATORY_CARE_PROVIDER_SITE_OTHER): Payer: Medicare Other | Admitting: Family Medicine

## 2012-06-24 ENCOUNTER — Encounter: Payer: Self-pay | Admitting: Family Medicine

## 2012-06-24 VITALS — BP 130/82 | HR 84 | Temp 98.5°F | Ht 61.5 in | Wt 134.5 lb

## 2012-06-24 DIAGNOSIS — G40909 Epilepsy, unspecified, not intractable, without status epilepticus: Secondary | ICD-10-CM

## 2012-06-24 DIAGNOSIS — C50919 Malignant neoplasm of unspecified site of unspecified female breast: Secondary | ICD-10-CM

## 2012-06-24 NOTE — Progress Notes (Signed)
Subjective:    Patient ID: Brandi [name], female    DOB: 27-Oct-1936, 76 y.o.   MRN: [id number]  HPI CC: new pt to establish  PCP prior saw Dr. Parke Simmers, did not like long waits.  Saw for 1 year.  New pt 76 yo with h/o seizure disorder and breast cancer s/p mastectomy presents to establish care.    H/o seizure disorder - first seizure in 1995, currently on dilantin and tolerating well.  Sees Dr. [name].  H/o breast cancer - s/p left lumpectomy, XRT and chemo 2000.  Sees Dr. Darnelle Catalan yearly.  Preventative: Last CPE was with Dr. Parke Simmers- asks we request record for last physical. Mammogram - 12/2011 [facility]  Denies falls in last year.   Denies depression/anxiety, anhedonia.  Caffeine: minimal cup coffee Lives alone, 1 dog Occupation: retired, was CNA Edu: HS Activity: bicycles, walking some  Medications and allergies reviewed and updated in chart.  Past histories reviewed and updated if relevant as below. Patient Active Problem List  Diagnosis  . Breast cancer   Past Medical History  Diagnosis Date  . Seizure disorder   . Breast cancer     s/p L lumpectomy and radiation/chemo  . Arthritis    Past Surgical History  Procedure Date  . Breast lumpectomy 2001    breast cancer   History  Substance Use Topics  . Smoking status: Never Smoker   . Smokeless tobacco: Never Used  . Alcohol Use: No   Family History  Problem Relation Age of Onset  . Cancer Brother     lung, smoker  . Stroke Brother   . Coronary artery disease Neg Hx   . Coronary artery disease Neg Hx    Allergies  Allergen Reactions  . Tegretol (Carbamazepine) Itching   Current Outpatient Prescriptions on File Prior to Visit  Medication Sig Dispense Refill  . aspirin 81 MG tablet Take 81 mg by mouth daily.      . Calcium Carb-Cholecalciferol (CALCIUM-VITAMIN D) 600-400 MG-UNIT TABS Take 1 tablet by mouth daily.      Marland Kitchen DILANTIN 100 MG ER capsule Take 100 mg by mouth 3 (three) times daily.       . Multiple  Vitamin (MULTIVITAMIN) capsule Take 1 capsule by mouth daily.        . fish oil-omega-3 fatty acids 1000 MG capsule Take 1 g by mouth daily.         Review of Systems  Constitutional: Negative for fever, chills, activity change, appetite change, fatigue and unexpected weight change.  HENT: Negative for hearing loss and neck pain.   Eyes: Negative for visual disturbance.  Respiratory: Negative for cough, chest tightness, shortness of breath and wheezing.   Cardiovascular: Negative for chest pain, palpitations and leg swelling.  Gastrointestinal: Negative for nausea, vomiting, abdominal pain, diarrhea, constipation, blood in stool and abdominal distention.  Genitourinary: Negative for hematuria and difficulty urinating.  Musculoskeletal: Negative for myalgias and arthralgias.  Skin: Negative for rash.  Neurological: Negative for dizziness, seizures, syncope and headaches.  Hematological: Does not bruise/bleed easily.  Psychiatric/Behavioral: Negative for dysphoric mood. The patient is not nervous/anxious.        Objective:   Physical Exam  Nursing note and vitals reviewed. Constitutional: She is oriented to person, place, and time. She appears well-developed and well-nourished. No distress.  HENT:  Head: Normocephalic and atraumatic.  Right Ear: Hearing, tympanic membrane, external ear and ear canal normal.  Left Ear: Hearing, tympanic membrane, external ear and ear canal normal.  Nose: Nose normal.  Mouth/Throat: Oropharynx is clear and moist. No oropharyngeal exudate.  Eyes: Conjunctivae and EOM are normal. Pupils are equal, round, and reactive to light. No scleral icterus.  Neck: Normal range of motion. Neck supple. Carotid bruit is not present.  Cardiovascular: Normal rate, regular rhythm, normal heart sounds and intact distal pulses.   No murmur heard. Pulses:      Radial pulses are 2+ on the right side, and 2+ on the left side.  Pulmonary/Chest: Effort normal and breath sounds  normal. No respiratory distress. She has no wheezes. She has no rales.  Abdominal: Soft. Bowel sounds are normal. She exhibits no distension and no mass. There is no tenderness. There is no rebound and no guarding.  Musculoskeletal: Normal range of motion. She exhibits no edema.  Lymphadenopathy:    She has no cervical adenopathy.  Neurological: She is alert and oriented to person, place, and time.       CN grossly intact, station and gait intact  Skin: Skin is warm and dry. No rash noted.  Psychiatric: She has a normal mood and affect. Her behavior is normal. Judgment and thought content normal.       Assessment & Plan:

## 2012-06-24 NOTE — Patient Instructions (Addendum)
Return in April for physical, prior fasting for blood work. I will request records from Dr. Parke Simmers and review for latest physical Good to meet you today, call us with questions.  Try to get most or all of your calcium from your food--aim for 1000 mg/day for women up to 50 and men up to 70 and 1200 mg/day for women over 50 and men over 70.  To figure out dietary calcium: 300 mg/day from all non dairy foods plus 300 mg per cup of milk, other dairy, or fortified juice.  Non dairy foods that contain calcium: Kale, oranges, sardines, oatmeal, soy milk/soybeans, salmon, white beans, dried figs, turnip greens, almonds, broccoli, tofu.  If unable to get goal calcium in diet, add supplements.

## 2012-06-24 NOTE — Assessment & Plan Note (Signed)
Stable.  Sees Dr. Darnelle Catalan regularly.

## 2012-06-24 NOTE — Assessment & Plan Note (Signed)
Stable on dilantin.  Followed by neurology. Will await records from neurology.

## 2012-10-30 ENCOUNTER — Ambulatory Visit: Payer: Medicare Other | Admitting: Hematology & Oncology

## 2012-10-30 ENCOUNTER — Other Ambulatory Visit: Payer: Medicare Other | Admitting: Lab

## 2013-01-05 ENCOUNTER — Other Ambulatory Visit: Payer: Self-pay | Admitting: Physician Assistant

## 2013-01-06 ENCOUNTER — Telehealth: Payer: Self-pay | Admitting: *Deleted

## 2013-01-06 NOTE — Telephone Encounter (Signed)
sw pt she is aware of her appt d/t for 02/24/2013 starting with lab @ 1:15pm and Berry @ 1:45pm.

## 2013-02-15 ENCOUNTER — Other Ambulatory Visit: Payer: Medicare Other

## 2013-02-22 ENCOUNTER — Ambulatory Visit: Payer: Medicare Other | Admitting: Physician Assistant

## 2013-02-22 ENCOUNTER — Encounter: Payer: Medicare Other | Admitting: Family Medicine

## 2013-02-22 ENCOUNTER — Other Ambulatory Visit: Payer: Medicare Other | Admitting: Lab

## 2013-02-23 ENCOUNTER — Other Ambulatory Visit: Payer: Self-pay | Admitting: *Deleted

## 2013-02-23 DIAGNOSIS — C50919 Malignant neoplasm of unspecified site of unspecified female breast: Secondary | ICD-10-CM

## 2013-02-24 ENCOUNTER — Ambulatory Visit (HOSPITAL_BASED_OUTPATIENT_CLINIC_OR_DEPARTMENT_OTHER): Payer: Medicare Other | Admitting: Physician Assistant

## 2013-02-24 ENCOUNTER — Telehealth: Payer: Self-pay | Admitting: Oncology

## 2013-02-24 ENCOUNTER — Encounter: Payer: Self-pay | Admitting: Physician Assistant

## 2013-02-24 ENCOUNTER — Other Ambulatory Visit (HOSPITAL_BASED_OUTPATIENT_CLINIC_OR_DEPARTMENT_OTHER): Payer: Medicare Other | Admitting: Lab

## 2013-02-24 VITALS — BP 139/75 | HR 80 | Temp 97.5°F | Resp 20 | Ht 61.5 in | Wt 134.3 lb

## 2013-02-24 DIAGNOSIS — Z853 Personal history of malignant neoplasm of breast: Secondary | ICD-10-CM

## 2013-02-24 DIAGNOSIS — Z1231 Encounter for screening mammogram for malignant neoplasm of breast: Secondary | ICD-10-CM

## 2013-02-24 DIAGNOSIS — C50919 Malignant neoplasm of unspecified site of unspecified female breast: Secondary | ICD-10-CM

## 2013-02-24 LAB — CBC WITH DIFFERENTIAL/PLATELET
BASO%: 0.9 % (ref 0.0–2.0)
Basophils Absolute: 0.1 10*3/uL (ref 0.0–0.1)
HCT: 40.7 % (ref 34.8–46.6)
HGB: 13.3 g/dL (ref 11.6–15.9)
LYMPH%: 23 % (ref 14.0–49.7)
MCH: 29.4 pg (ref 25.1–34.0)
MCV: 89.7 fL (ref 79.5–101.0)
lymph#: 1.9 10*3/uL (ref 0.9–3.3)

## 2013-02-24 LAB — COMPREHENSIVE METABOLIC PANEL (CC13)
ALT: 18 U/L (ref 0–55)
Albumin: 3.3 g/dL — ABNORMAL LOW (ref 3.5–5.0)
Alkaline Phosphatase: 91 U/L (ref 40–150)
CO2: 29 mEq/L (ref 22–29)
Glucose: 110 mg/dl — ABNORMAL HIGH (ref 70–99)
Potassium: 3.6 mEq/L (ref 3.5–5.1)
Sodium: 141 mEq/L (ref 136–145)
Total Bilirubin: <0.2 mg/dL (ref 0.20–1.20)
Total Protein: 7.1 g/dL (ref 6.4–8.3)

## 2013-02-24 NOTE — Progress Notes (Signed)
ID: Horton Chin   DOB: [birth year]  MR#: [id number]  CSN#:[id number]  PCP: [name], MD GYN:  SU:  OTHER MD:   HISTORY OF PRESENT ILLNESS: Ms. Kobler has a remote history of left breast carcinoma, originally diagnosed in 2000. She is status post left lumpectomy and axillary lymph node sampling in October of 2000 for a T1c N1(mic), stage IB invasive ductal carcinoma, grade 2, estrogen receptor 98% positive, progesterone receptor 93% positive, with no HER-2 amplification, and an MIB-1 of 28%.  Following her lumpectomy, Ms. Beverely Pace underwent adjuvant chemotherapy consisting of 4 cycles of epirubicin/cyclophosphamide. This was followed by radiation, after which she started on tamoxifen.  She was previously been followed by Dr. Myna Hidalgo in [facility], but recently transferred her care to Dr. Darnelle Catalan because she "can't drive that far anymore".    INTERVAL HISTORY: Anaka returns here for a routine one-year followup of her left breast carcinoma. Interval history is unremarkable, and she is feeling very well. She tries to keep herself busy, and does work around the house.  Laporcha has a history of seizure activity, followed by Dr. love, but tells me she has not had seizures in many years. She continues on Dilantin.  REVIEW OF SYSTEMS: Teyona has had no recent illnesses and denies any fevers or chills. She's had a runny nose she attributes to seasonal allergies. She's had no cough, phlegm production, shortness of breath, or chest pain. She denies any rashes or skin changes. She's had no abnormal bruising or bleeding. She's eating and drinking well denies any nausea or change in bowel habits. Some days her appetite is better than others. She denies any abnormal headaches or dizziness, and as noted above, has had no seizure activity. She has some arthritic pain, but "nothing new", and denies any peripheral swelling.  A detailed review of systems is otherwise stable and noncontributory.  PAST MEDICAL  HISTORY: Past Medical History  Diagnosis Date  . Seizure disorder     followed by Dr. [name]  . Breast cancer     s/p L lumpectomy and radiation/chemo  . Arthritis     PAST SURGICAL HISTORY: Past Surgical History  Procedure Laterality Date  . Breast lumpectomy  2001    breast cancer    FAMILY HISTORY Family History  Problem Relation Age of Onset  . Cancer Brother     lung, smoker  . Stroke Brother   . Coronary artery disease Neg Hx   . Coronary artery disease Neg Hx   The patient's father died at the age of 44 from "natural causes". The patient's mother died at the age of 65, from unclear causes. The patient had 2 brothers, one died from lung cancer in the setting of tobacco abuse; the other one "up and died" at age 63. There is no history of breast cancer in the family to the patient's knowledge.   GYNECOLOGIC HISTORY:  Menarche age 77, she is GX P0. She does not recall when she went through menopause. She never took hormone replacement.   SOCIAL HISTORY: She used to work as a Company secretary, but is now retired. She lives by herself, with her dog Lady. Her adopted daughter, Aurea Graff, lives in [place] and works in Presenter, broadcasting for [employer]   ADVANCED DIRECTIVES: The patient's daughter [name] is healthcare power of attorney. She can be reached through her cell #[phone], or at home, at [phone]).    HEALTH MAINTENANCE: History  Substance Use Topics  . Smoking status:  Never Smoker   . Smokeless tobacco: Never Used  . Alcohol Use: No     Colonoscopy:  Approx 2012  PAP:  Bone density: 2011, osteopenia  Lipid panel:  Allergies  Allergen Reactions  . Tegretol (Carbamazepine) Itching    Current Outpatient Prescriptions  Medication Sig Dispense Refill  . aspirin 81 MG tablet Take 81 mg by mouth daily.      . Calcium Carb-Cholecalciferol (CALCIUM-VITAMIN D) 600-400 MG-UNIT TABS Take 1 tablet by mouth daily.      Marland Kitchen DILANTIN 100 MG ER capsule  Take 100 mg by mouth 3 (three) times daily.       . fish oil-omega-3 fatty acids 1000 MG capsule Take 1 g by mouth daily.      Marland Kitchen glucosamine-chondroitin 500-400 MG tablet Take 1 tablet by mouth daily.      . Multiple Vitamin (MULTIVITAMIN) capsule Take 1 capsule by mouth daily.         No current facility-administered medications for this visit.    OBJECTIVE: Elderly African American woman who appears comfortable and is in no acute distress Filed Vitals:   02/24/13 1341  BP: 139/75  Pulse: 80  Temp: 97.5 F (36.4 C)  Resp: 20     Body mass index is 24.97 kg/(m^2).    ECOG FS: 1 Filed Weights   02/24/13 1341  Weight: 134 lb 4.8 oz (60.918 kg)   Sclerae unicteric Oropharynx clear No cervical or supraclavicular adenopathy Lungs clear to auscultation bilaterally, no rhonchi or wheezes. Heart regular rate and rhythm Abdomen soft, nontender to palpation, with positive bowel sounds MSK no focal spinal tenderness, no peripheral edema Neuro: nonfocal, well oriented with positive affect Breasts: Right breast is unremarkable. Left breast is status post lumpectomy remotely, with no evidence of local recurrence. Axillae are benign bilaterally with no palpable adenopathy.   LAB RESULTS: Lab Results  Component Value Date   WBC 8.2 02/24/2013   NEUTROABS 5.3 02/24/2013   HGB 13.3 02/24/2013   HCT 40.7 02/24/2013   MCV 89.7 02/24/2013   PLT 264 02/24/2013      Chemistry      Component Value Date/Time   NA 138 05/21/2012 1610   K 3.5 05/21/2012 1610   CL 100 05/21/2012 1610   CO2 28 05/21/2012 1610   BUN 15 05/21/2012 1610   CREATININE 0.72 05/21/2012 1610      Component Value Date/Time   CALCIUM 9.4 05/21/2012 1610   ALKPHOS 89 05/21/2012 1610   AST 18 05/21/2012 1610   ALT 18 05/21/2012 1610   BILITOT 0.1* 05/21/2012 1610       STUDIES:  01/21/2012 DG SCREEN MAMMOGRAM BILATERAL  Bilateral CC and MLO view(s) were taken.  DIGITAL SCREENING MAMMOGRAM WITH CAD:  There are scattered  fibroglandular densities. No masses or malignant type calcifications are  identified. Compared with prior studies.  Images were processed with CAD.  IMPRESSION:  No specific mammographic evidence of malignancy. Next screening mammogram is recommended in one  year.  A result letter of this screening mammogram will be mailed directly to the patient.  ASSESSMENT: Negative - BI-RADS 1  Screening mammogram in 1 year.     ASSESSMENT: 77 y.o. [place] woman,   (1)  status post left lumpectomy and axillary lymph node sampling October of 2000 for a T1c N1(mic), stage IB invasive ductal carcinoma, grade 2, estrogen receptor 98% positive, progesterone receptor 93% positive, with no HER-2 amplification, and an MIB-1 of 28%;   (2)  status post  4 cycles of cyclophosphamide/ epirubicin,  (3)   followed by radiation,   (4)  followed by tamoxifen.   PLAN: Chanese appears to be doing quite well, especially with regards to her breast cancer. We explained once again that we would be very comfortable releasing her back to her primary care physician, but she tells me she finds it very reassuring to be seen here at least once yearly, and would like to continue to see [place] accordingly.  Andree [facility]. We will see her in one year and we'll repeat labs and physical exam at that time. She knows to call us prior that time, however, with any changes or problems.    Mickell Birdwell    02/24/2013

## 2013-03-01 ENCOUNTER — Encounter: Payer: Self-pay | Admitting: Family Medicine

## 2013-03-11 ENCOUNTER — Ambulatory Visit
Admission: RE | Admit: 2013-03-11 | Discharge: 2013-03-11 | Disposition: A | Discharge status: Home / Self Care | Payer: Medicare Other | Source: Ambulatory Visit | Attending: Physician Assistant | Admitting: Physician Assistant

## 2013-03-11 DIAGNOSIS — Z1231 Encounter for screening mammogram for malignant neoplasm of breast: Secondary | ICD-10-CM

## 2013-04-28 ENCOUNTER — Other Ambulatory Visit: Payer: Self-pay | Admitting: Physician Assistant

## 2013-04-28 ENCOUNTER — Telehealth: Payer: Self-pay | Admitting: *Deleted

## 2013-04-28 ENCOUNTER — Ambulatory Visit: Payer: Medicare Other | Admitting: Oncology

## 2013-04-28 DIAGNOSIS — Z1231 Encounter for screening mammogram for malignant neoplasm of breast: Secondary | ICD-10-CM

## 2013-04-28 NOTE — Telephone Encounter (Signed)
Lm gv appt for 03/14/13 @11 :40 am [facility], and gv appt for labs and ov on 03/17/14 arriving at 1:15pm. Pt is aware that i will mail a letter/cal as well...td

## 2013-06-17 ENCOUNTER — Other Ambulatory Visit: Payer: Self-pay | Admitting: Family Medicine

## 2013-06-17 DIAGNOSIS — Z0000 Encounter for general adult medical examination without abnormal findings: Secondary | ICD-10-CM

## 2013-06-17 DIAGNOSIS — G40909 Epilepsy, unspecified, not intractable, without status epilepticus: Secondary | ICD-10-CM

## 2013-06-18 ENCOUNTER — Other Ambulatory Visit (INDEPENDENT_AMBULATORY_CARE_PROVIDER_SITE_OTHER): Payer: BC Managed Care – PPO

## 2013-06-18 DIAGNOSIS — Z0000 Encounter for general adult medical examination without abnormal findings: Secondary | ICD-10-CM

## 2013-06-18 DIAGNOSIS — G40909 Epilepsy, unspecified, not intractable, without status epilepticus: Secondary | ICD-10-CM

## 2013-06-18 LAB — COMPREHENSIVE METABOLIC PANEL
BUN: 12 mg/dL (ref 6–23)
CO2: 29 mEq/L (ref 19–32)
Creatinine, Ser: 0.8 mg/dL (ref 0.4–1.2)
GFR: 96.86 mL/min (ref >60.00)
Glucose, Bld: 100 mg/dL — ABNORMAL HIGH (ref 70–99)
Sodium: 138 mEq/L (ref 135–145)
Total Bilirubin: 0.3 mg/dL (ref 0.3–1.2)
Total Protein: 7.3 g/dL (ref 6.0–8.3)

## 2013-06-18 LAB — LIPID PANEL
Cholesterol: 181 mg/dL (ref 0–200)
Triglycerides: 144 mg/dL (ref 0.0–149.0)
VLDL: 28.8 mg/dL (ref 0.0–40.0)

## 2013-06-22 LAB — PHENYTOIN LEVEL, FREE: Phenytoin, Free: 1.2 ug/mL (ref 1.0–2.0)

## 2013-06-25 ENCOUNTER — Encounter: Payer: Self-pay | Admitting: Family Medicine

## 2013-06-25 ENCOUNTER — Ambulatory Visit (INDEPENDENT_AMBULATORY_CARE_PROVIDER_SITE_OTHER): Payer: Medicare Other | Admitting: Family Medicine

## 2013-06-25 VITALS — BP 140/70 | HR 89 | Temp 97.9°F | Ht 61.5 in | Wt 128.0 lb

## 2013-06-25 DIAGNOSIS — Z1211 Encounter for screening for malignant neoplasm of colon: Secondary | ICD-10-CM

## 2013-06-25 DIAGNOSIS — C50912 Malignant neoplasm of unspecified site of left female breast: Secondary | ICD-10-CM

## 2013-06-25 DIAGNOSIS — G40909 Epilepsy, unspecified, not intractable, without status epilepticus: Secondary | ICD-10-CM

## 2013-06-25 DIAGNOSIS — Z0000 Encounter for general adult medical examination without abnormal findings: Secondary | ICD-10-CM

## 2013-06-25 DIAGNOSIS — Z2911 Encounter for prophylactic immunotherapy for respiratory syncytial virus (RSV): Secondary | ICD-10-CM

## 2013-06-25 DIAGNOSIS — Z23 Encounter for immunization: Secondary | ICD-10-CM

## 2013-06-25 DIAGNOSIS — C50919 Malignant neoplasm of unspecified site of unspecified female breast: Secondary | ICD-10-CM

## 2013-06-25 NOTE — Progress Notes (Signed)
Subjective:    Patient ID: Brandi [name], female    DOB: 26-Oct-1936, 77 y.o.   MRN: [id number]  HPI CC: medicare wellness visit  Pleasant 77 yo with h/o breast cancer s/p lumpectomy and seizure disorder stable on dilantin presents today for medicare wellness visit.  Wt Readings from Last 3 Encounters:  06/25/13 128 lb (58.06 kg)  02/24/13 134 lb 4.8 oz (60.918 kg)  06/24/12 134 lb 8 oz (61.009 kg)    Preventative:   Mammogram - 02/2013 Birads1.  Sees onc regularly - breast exam at [facility]. Colonoscopy - unsure if she's had this done.  Will do IFOB while she finds out about latest colonoscopy - I wil lalso request records from prior PCP Dr. Parke Simmers. DEXA per pt a few years ago, no osteoporosis per patient. Flu shot - yearly - today. Pneumovax - has had but unsure when Tetanus - long time ago zostavax - has not had - requests today. Advanced directives - completed living will - has at home.  Would like packet regardless. ADVANCED DIRECTIVES: The patient's daughter [name] is healthcare power of attorney. She can be reached through her cell #[phone], or at home, at [phone]).   Hearing and vision screens passed.  1 mechanical fall in the last year - tripped over cone. Denies depression/anxiety, anhedonia.  Seat belt use discussed.  Caffeine: minimal cup coffee  Lives alone, 1 dog  Occupation: retired, was CNA  Edu: HS  Activity: bicycles, walking some  Medications and allergies reviewed and updated in chart.  Past histories reviewed and updated if relevant as below. Patient Active Problem List   Diagnosis Date Noted  . Seizure disorder   . Breast cancer    Past Medical History  Diagnosis Date  . Seizure disorder     followed by Dr. [name]  . Breast cancer 2000    s/p L lumpectomy and radiation/chemo  . Arthritis    Past Surgical History  Procedure Laterality Date  . Breast lumpectomy  2001    breast cancer   History  Substance Use Topics  . Smoking status:  Never Smoker   . Smokeless tobacco: Never Used  . Alcohol Use: No   Family History  Problem Relation Age of Onset  . Cancer Brother     lung, smoker  . Stroke Brother   . Coronary artery disease Neg Hx   . Coronary artery disease Neg Hx    Allergies  Allergen Reactions  . Tegretol [Carbamazepine] Itching  . Ibuprofen     Feels whoozy   Current Outpatient Prescriptions on File Prior to Visit  Medication Sig Dispense Refill  . aspirin 81 MG tablet Take 81 mg by mouth daily.      Marland Kitchen DILANTIN 100 MG ER capsule Take 100 mg by mouth 3 (three) times daily.       . fish oil-omega-3 fatty acids 1000 MG capsule Take 1 g by mouth daily.      Marland Kitchen glucosamine-chondroitin 500-400 MG tablet Take 1 tablet by mouth daily.      . Multiple Vitamin (MULTIVITAMIN) capsule Take 1 capsule by mouth daily.         No current facility-administered medications on file prior to visit.     Review of Systems  Constitutional: Negative for fever, chills, activity change, appetite change, fatigue and unexpected weight change.       Feet stay cold  HENT: Negative for hearing loss and neck pain.   Eyes: Negative for visual disturbance.  Respiratory: Negative for cough, chest tightness, shortness of breath and wheezing.   Cardiovascular: Negative for chest pain, palpitations and leg swelling.  Gastrointestinal: Negative for nausea, vomiting, abdominal pain, diarrhea, constipation, blood in stool and abdominal distention.  Genitourinary: Negative for hematuria and difficulty urinating.  Musculoskeletal: Negative for myalgias and arthralgias.  Skin: Negative for rash.  Neurological: Negative for dizziness, seizures, syncope and headaches.  Hematological: Negative for adenopathy. Does not bruise/bleed easily.  Psychiatric/Behavioral: Negative for dysphoric mood. The patient is not nervous/anxious.        Objective:   Physical Exam  Nursing note and vitals reviewed. Constitutional: She is oriented to person,  place, and time. She appears well-developed and well-nourished. No distress.  HENT:  Head: Normocephalic and atraumatic.  Right Ear: Hearing, tympanic membrane, external ear and ear canal normal.  Left Ear: Hearing, tympanic membrane, external ear and ear canal normal.  Nose: Nose normal.  Mouth/Throat: Uvula is midline, oropharynx is clear and moist and mucous membranes are normal. No oropharyngeal exudate, posterior oropharyngeal edema, posterior oropharyngeal erythema or tonsillar abscesses.  Eyes: Conjunctivae and EOM are normal. Pupils are equal, round, and reactive to light. No scleral icterus.  Neck: Normal range of motion. Neck supple. Carotid bruit is not present. No thyromegaly present.  Cardiovascular: Normal rate, regular rhythm, normal heart sounds and intact distal pulses.   No murmur heard. Pulses:      Radial pulses are 2+ on the right side, and 2+ on the left side.  Pulmonary/Chest: Effort normal and breath sounds normal. No respiratory distress. She has no wheezes. She has no rales.  Abdominal: Soft. Bowel sounds are normal. She exhibits no distension and no mass. There is no tenderness. There is no rebound and no guarding.  Musculoskeletal: Normal range of motion. She exhibits no edema.  Lymphadenopathy:    She has no cervical adenopathy.  Neurological: She is alert and oriented to person, place, and time.  CN grossly intact, station and gait intact  Skin: Skin is warm and dry. No rash noted.  Psychiatric: She has a normal mood and affect. Her behavior is normal. Judgment and thought content normal.       Assessment & Plan:

## 2013-06-25 NOTE — Addendum Note (Signed)
Addended by: Eliezer Bottom on: 06/25/2013 10:15 AM   Modules accepted: Orders

## 2013-06-25 NOTE — Assessment & Plan Note (Addendum)
I have personally reviewed the Medicare Annual Wellness questionnaire and have noted 1. The patient's medical and social history 2. Their use of alcohol, tobacco or illicit drugs 3. Their current medications and supplements 4. The patient's functional ability including ADL's, fall risks, home safety risks and hearing or visual impairment. 5. Diet and physical activity 6. Evidence for depression or mood disorders The patients weight, height, BMI have been recorded in the chart.  Hearing and vision has been addressed. I have made referrals, counseling and provided education to the patient based review of the above and I have provided the pt with a written personalized care plan for preventive services. See scanned questionairre. Advanced directives discussed: has at home.  Will bring me copy.  Reviewed preventative protocols and updated unless pt declined. iFOB today while pt finds out about last colonoscopy.  No records in chart. UTD mammogram.  May establish with OBGYN for well woman. DEXA done 2011 - osteopenia. Pt states would like to have shingles shot as well as flu shot today - discussed concerns with copay , pt opts to have done today regardless.

## 2013-06-25 NOTE — Assessment & Plan Note (Signed)
Stable on dilantin

## 2013-06-25 NOTE — Patient Instructions (Addendum)
Find out about colon cancer screening.  In meantime we will send you home with stool kit. Advanced directive packet provided today - bring me copy if you find it at home. Shingles and flu shots today. Good to see you today, call us with questions. Return as needed or in 1 year for next medicare wellness visit If you'd like to establish with OBGYN for well woman exam, look across the street at [facility].

## 2013-06-25 NOTE — Assessment & Plan Note (Signed)
13 years cancer free.

## 2013-08-27 ENCOUNTER — Telehealth: Payer: Self-pay | Admitting: Neurology

## 2013-08-27 NOTE — Telephone Encounter (Signed)
Patient has been scheduled 09/15/13 and prescription sent

## 2013-08-27 NOTE — Telephone Encounter (Deleted)
Patient has been sched 11/14,please refill dilantin

## 2013-08-27 NOTE — Telephone Encounter (Deleted)
Patient has been scheduled 09/10/13,

## 2013-08-27 NOTE — Telephone Encounter (Signed)
Scheduled 11/19 @3 :30

## 2013-08-30 ENCOUNTER — Other Ambulatory Visit: Payer: Self-pay

## 2013-08-30 DIAGNOSIS — M810 Age-related osteoporosis without current pathological fracture: Secondary | ICD-10-CM

## 2013-08-30 MED ORDER — PHENYTOIN SODIUM EXTENDED 100 MG PO CAPS: 100.0000 mg | ORAL_CAPSULE | Freq: Three times a day (TID) | ORAL | Status: DC

## 2013-09-15 ENCOUNTER — Encounter: Payer: Self-pay | Admitting: Neurology

## 2013-09-15 ENCOUNTER — Ambulatory Visit (INDEPENDENT_AMBULATORY_CARE_PROVIDER_SITE_OTHER): Payer: Medicare Other | Admitting: Neurology

## 2013-09-15 VITALS — BP 113/70 | HR 83 | Resp 16 | Ht 62.0 in | Wt 134.0 lb

## 2013-09-15 DIAGNOSIS — G40802 Other epilepsy, not intractable, without status epilepticus: Secondary | ICD-10-CM | POA: Insufficient documentation

## 2013-09-15 DIAGNOSIS — G609 Hereditary and idiopathic neuropathy, unspecified: Secondary | ICD-10-CM

## 2013-09-15 DIAGNOSIS — M810 Age-related osteoporosis without current pathological fracture: Secondary | ICD-10-CM

## 2013-09-15 DIAGNOSIS — Z5181 Encounter for therapeutic drug level monitoring: Secondary | ICD-10-CM | POA: Insufficient documentation

## 2013-09-15 HISTORY — DX: Encounter for therapeutic drug level monitoring: Z51.81

## 2013-09-15 MED ORDER — DILANTIN 100 MG PO CAPS: 100.0000 mg | ORAL_CAPSULE | Freq: Three times a day (TID) | ORAL | Status: DC

## 2013-09-15 NOTE — Patient Instructions (Signed)
Phenytoin Toxicity Phenytoin is a medicine that is used to help control many seizure problems. Phenytoin toxicity means the amount of phenytoin in the blood is high enough to cause problems. Your caregiver will carefully and frequently monitor the amount of phenytoin in the blood to make sure that just the right dose is given. How you feel and your blood test information help guide your caregiver in making sure that a specific dose is best for you.  CAUSES  Changes in other prescription medicines can directly influence the amount of phenytoin in the blood, even though the amount of phenytoin taken remains unchanged. The following medicines may raise the level of phenytoin in the blood:   Amiodarone.  Chlordiazepoxide.  Diazepam.  Dicumarol (blood thinner).  Disulfiram.  Estrogens (hormone replacement).  Aspirin and medicines containing salicylates.  Sulfonamides.  Tolbutamide (used for diabetes management).  Famotidine (used for ulcer disease).  Isoniazid (antibiotic).  Methylphenidate (used for attention deficit disorders).  Phenothiazines (used for nausea).  Trazedone (used for depression and sleep problems).  Primidone.  Fluconazole (used for yeast infections).  Valproic acid (used for seizures). Patients taking many medicines for a variety of ailments may accidentally take too much phenytoin. A change in metabolism or heavy, binge drinking of alcohol can also raise the level of phenytoin. In rare cases, problems develop when phenytoin is given directly into a vein, and it is given too fast. Emergency department and hospital staff giving phenytoin this way avoid this problem by the use of intravenous (IV) pumps and careful monitoring. SYMPTOMS  In mild cases, symptoms may not be pronounced. They may develop slowly over weeks or months. They can include:   Poor balance or weakness when walking.  Slow movement with all activity.  Tremors or  shaking.  Irritability.  Confusion.  Loss of control of urine.  Trouble speaking or swallowing.  Double vision.  Tender and swollen gums.  Unusual pains in the arms or legs.  Abnormal hair growth.  Nausea or vomiting.  Problems with functioning of the thyroid gland. In severe cases there may be:  Disorientation.  Severe confusion.  Coma.  Uncontrolled movement of arms or legs.  Trouble breathing.  Yellowing of the skin or eyes (jaundice).  Skin rashes.  Swelling of the face.  Abdominal pain with possible enlargement of the liver or spleen. In some cases, too much phenytoin is taken intentionally in a suicide attempt. This can be very serious and requires emergency care and hospitalization. DIAGNOSIS  Your caregiver will ask for several blood tests, such as:  A blood count.  Kidney or liver tests.  Levels of glucose (sugar) and normal salts in the blood.  Phenytoin and other medicine levels.  Pregnancy test, when appropriate. Other tests may also be needed such as:  Urine test (urinalysis).  A test that records the electrical activity of the heart (electrocardiography, EKG).  X-rays. If you are experiencing confusion or disorientation, a computerized X-ray scan (CT or CAT scan) of the head may be needed.  TREATMENT  In mild cases, the phenytoin is stopped for a period of time as directed by your caregiver. The time period might be as short as only 1 or 2 days and will be followed by careful checking of symptoms and repeat blood tests. A review of all medicines (prescription, nonprescription, and herbal) should be done at this time. In more severe cases, hospitalization is mandatory and may include monitoring in an intensive care unit. If there is severe disorientation or coma,  or if problems are found in the liver or heart, help is obtained from medical specialists. HOME CARE INSTRUCTIONS   Be careful when taking your daily medicines. It is very  important to ask for help if you need it. The same problems can develop again if medicine dosages change.  Follow through with blood tests and office visits as directed by your caregiver. SEEK MEDICAL CARE IF:   You are confused about how to take your medicines.  You plan to adjust dosages of any of your prescription or nonprescription medicines. This may affect the level of phenytoin in your blood.  You develop any of the "mild case" symptoms described above.  You have episodes of falling or have problems keeping your balance. SEEK IMMEDIATE MEDICAL CARE IF:   You fall and are injured.  You develop any of the "severe case" symptoms described above. MAKE SURE YOU:   Understand these instructions.  Will watch your condition.  Will get help right away if you are not doing well or get worse. Document Released: 02/13/2007 Document Revised: 01/06/2012 Document Reviewed: 10/02/2007 [facility] Patient Information 2014 [place], [place].
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
## 2013-09-16 ENCOUNTER — Ambulatory Visit: Payer: Self-pay | Admitting: Neurology

## 2013-09-28 ENCOUNTER — Ambulatory Visit (INDEPENDENT_AMBULATORY_CARE_PROVIDER_SITE_OTHER): Payer: Medicare Other | Admitting: Radiology

## 2013-09-28 DIAGNOSIS — G609 Hereditary and idiopathic neuropathy, unspecified: Secondary | ICD-10-CM

## 2013-09-28 DIAGNOSIS — G40802 Other epilepsy, not intractable, without status epilepticus: Secondary | ICD-10-CM

## 2013-09-28 DIAGNOSIS — Z5181 Encounter for therapeutic drug level monitoring: Secondary | ICD-10-CM
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
## 2013-10-15 ENCOUNTER — Telehealth: Payer: Self-pay

## 2013-10-15 NOTE — Telephone Encounter (Signed)
I called patient and reviewed your findings. Patient is not sure what you want her to wean off of at this time. She will continue to take her seizure medication unless you instruct otherwise.

## 2013-10-15 NOTE — Telephone Encounter (Signed)
Message copied by [facility] on Fri Oct 15, 2013  3:40 PM ------      Message from: Vickey Huger, CARMEN      Created: Mon Oct 11, 2013  4:12 PM       Call with recommendations ------

## 2013-10-15 NOTE — Telephone Encounter (Signed)
No need to wean . i wanted to see if she could wean off medication, not that she should wean off.

## 2013-10-19 NOTE — Telephone Encounter (Signed)
No further action required

## 2013-10-27 ENCOUNTER — Encounter: Payer: Self-pay | Admitting: Family Medicine

## 2013-11-05 NOTE — Progress Notes (Signed)
Quick Note:  Left message with results, slower EEG rhythm, and that she needs to remain on her seizure medications, per Dr. Brett Fairy. Told to call with further questions. ______

## 2014-03-01 ENCOUNTER — Other Ambulatory Visit: Payer: Medicare Other | Admitting: Lab

## 2014-03-01 ENCOUNTER — Ambulatory Visit: Payer: Medicare Other | Admitting: Physician Assistant

## 2014-03-14 ENCOUNTER — Ambulatory Visit
Admission: RE | Admit: 2014-03-14 | Discharge: 2014-03-14 | Disposition: A | Discharge status: Home / Self Care | Payer: Medicare Other | Source: Ambulatory Visit | Attending: Physician Assistant | Admitting: Physician Assistant

## 2014-03-14 ENCOUNTER — Encounter (INDEPENDENT_AMBULATORY_CARE_PROVIDER_SITE_OTHER): Payer: Self-pay

## 2014-03-14 DIAGNOSIS — Z1231 Encounter for screening mammogram for malignant neoplasm of breast: Secondary | ICD-10-CM

## 2014-03-15 ENCOUNTER — Other Ambulatory Visit: Payer: Self-pay | Admitting: Physician Assistant

## 2014-03-15 DIAGNOSIS — R928 Other abnormal and inconclusive findings on diagnostic imaging of breast: Secondary | ICD-10-CM

## 2014-03-17 ENCOUNTER — Encounter: Payer: Self-pay | Admitting: Physician Assistant

## 2014-03-17 ENCOUNTER — Other Ambulatory Visit (HOSPITAL_BASED_OUTPATIENT_CLINIC_OR_DEPARTMENT_OTHER): Payer: Medicare Other

## 2014-03-17 ENCOUNTER — Ambulatory Visit (HOSPITAL_BASED_OUTPATIENT_CLINIC_OR_DEPARTMENT_OTHER): Payer: Medicare Other | Admitting: Physician Assistant

## 2014-03-17 VITALS — BP 133/66 | HR 92 | Temp 98.4°F | Resp 18 | Ht 62.0 in | Wt 129.4 lb

## 2014-03-17 DIAGNOSIS — Z853 Personal history of malignant neoplasm of breast: Secondary | ICD-10-CM

## 2014-03-17 DIAGNOSIS — C50919 Malignant neoplasm of unspecified site of unspecified female breast: Secondary | ICD-10-CM

## 2014-03-17 LAB — COMPREHENSIVE METABOLIC PANEL (CC13)
ALBUMIN: 3.6 g/dL (ref 3.5–5.0)
ALK PHOS: 86 U/L (ref 40–150)
ALT: 15 U/L (ref 0–55)
AST: 16 U/L (ref 5–34)
Anion Gap: 10 mEq/L (ref 3–11)
BILIRUBIN TOTAL: 0.24 mg/dL (ref 0.20–1.20)
BUN: 14.4 mg/dL (ref 7.0–26.0)
CO2: 24 mEq/L (ref 22–29)
Calcium: 9.2 mg/dL (ref 8.4–10.4)
Chloride: 106 mEq/L (ref 98–109)
Creatinine: 0.9 mg/dL (ref 0.6–1.1)
GLUCOSE: 132 mg/dL (ref 70–140)
POTASSIUM: 3.6 meq/L (ref 3.5–5.1)
SODIUM: 140 meq/L (ref 136–145)
TOTAL PROTEIN: 7.1 g/dL (ref 6.4–8.3)

## 2014-03-17 LAB — CBC WITH DIFFERENTIAL/PLATELET
BASO%: 1.4 % (ref 0.0–2.0)
Basophils Absolute: 0.1 10*3/uL (ref 0.0–0.1)
EOS ABS: 0.3 10*3/uL (ref 0.0–0.5)
EOS%: 3.6 % (ref 0.0–7.0)
HCT: 43.1 % (ref 34.8–46.6)
HGB: 13.7 g/dL (ref 11.6–15.9)
LYMPH%: 31.8 % (ref 14.0–49.7)
MCH: 28.9 pg (ref 25.1–34.0)
MCHC: 31.8 g/dL (ref 31.5–36.0)
MCV: 90.9 fL (ref 79.5–101.0)
MONO#: 0.5 10*3/uL (ref 0.1–0.9)
MONO%: 7.3 % (ref 0.0–14.0)
NEUT%: 55.9 % (ref 38.4–76.8)
NEUTROS ABS: 4.2 10*3/uL (ref 1.5–6.5)
Platelets: 264 10*3/uL (ref 145–400)
RBC: 4.74 10*6/uL (ref 3.70–5.45)
RDW: 12.5 % (ref 11.2–14.5)
WBC: 7.5 10*3/uL (ref 3.9–10.3)
lymph#: 2.4 10*3/uL (ref 0.9–3.3)

## 2014-03-17 NOTE — Progress Notes (Signed)
ID: Brandi [name]   DOB: February 04, 1936  MR#: [id number]  MGQ#:[id number]  PCP: Ria Bush, MD GYN:  SU:  OTHER MD: Larey Seat, MD  CHIEF COMPLAINT:  Hx of Left Breast Cancer   HISTORY OF PRESENT ILLNESS: Brandi [name] has a remote history of left breast carcinoma, originally diagnosed in 2000. She is status post left lumpectomy and axillary lymph node sampling in October of 2000 for a T1c N1(mic), stage IB invasive ductal carcinoma, grade 2, estrogen receptor 98% positive, progesterone receptor 93% positive, with no HER-2 amplification, and an MIB-1 of 28%.  Following her lumpectomy, Ms. Brandi [name] underwent adjuvant chemotherapy consisting of 4 cycles of epirubicin/cyclophosphamide. This was followed by radiation, after which she started on tamoxifen.  She was previously been followed by Dr. Marin Olp in [facility], but recently transferred her care to Dr. Jana Hakim because she "can't drive that far anymore".    INTERVAL HISTORY: Brandi [name] returns alone today for a routine one-year followup of her remote history of left breast carcinoma. She is followed here on an annual basis with labs and physical exam. She is feeling well today. In fact, physically, she tells me she has no complaints whatsoever.   Interval history is notable only for the fact that Brandi [name] had a bilateral screening mammogram earlier this week on 03/14/2014. This did show some evidence of calcifications in the right breast, and the left breast was unremarkable. They have recommended a diagnostic mammogram for further evaluation, and that has been scheduled next week on 03/25/2014. Brandi [name] herself has noted no changes in the right breast.  REVIEW OF SYSTEMS: Brandi [name] has had no recent illnesses and denies any fevers, chills, night sweats or hot flashes. She's had no rashes or skin changes and denies any abnormal bruising or bleeding. Her energy level is fairly good, and she tries to keep herself busy. She's eating and drinking well with a good  appetite. She's had no nausea or change in bowel or bladder habits. She denies any cough, shortness of breath, chest pain, or palpitations. She's had no abnormal headaches, dizziness, or significant change in vision. She is followed by Dr. Brett Fairy for a remote history of seizures, but has had no recent seizure activity. She does have some arthritis in her joints, especially her knees, but denies any new or unusual myalgias, arthralgias, or bony pain. She's had no peripheral swelling.   A detailed review of systems is otherwise stable and noncontributory.    PAST MEDICAL HISTORY: Past Medical History  Diagnosis Date  . Seizure disorder     followed by Dr. Erling Cruz  . Breast cancer 2000    s/p L lumpectomy and radiation/chemo  . Arthritis   . Osteopenia   . Encounter for medication monitoring 09/15/2013    PAST SURGICAL HISTORY: Past Surgical History  Procedure Laterality Date  . Breast lumpectomy  2001    breast cancer  . Dexa  10/2009    T score -2.3 spine    FAMILY HISTORY Family History  Problem Relation Age of Onset  . Cancer Brother     lung, smoker  . Stroke Brother   . Coronary artery disease Neg Hx   . Coronary artery disease Neg Hx   The patient's father died at the age of 51 from "natural causes". The patient's mother died at the age of 70, from unclear causes. The patient had 2 brothers, one died from lung cancer in the setting of tobacco abuse; the other one "up and died" at age 24.  There is no history of breast cancer in the family to the patient's knowledge.   GYNECOLOGIC HISTORY:   (Reviewed 03/17/2014) Menarche age 44, she is GX P0. She does not recall when she went through menopause. She never took hormone replacement.   SOCIAL HISTORY: (Reviewed 03/17/2014) She used to work as a Chartered certified accountant, but is now retired. She lives by herself, with her dog Brandi [name]. Her adopted daughter, Brandi [name], lives in [place] and works in Programmer, applications for [employer]: The patient's daughter Brandi [name] is healthcare power of attorney. She can be reached through her cell [phone], or at home, at [phone]).    HEALTH MAINTENANCE:  (Updated 03/17/2014) History  Substance Use Topics  . Smoking status: Never Smoker   . Smokeless tobacco: Never Used  . Alcohol Use: No     Colonoscopy:  Approx 2012  PAP:  Not on file  Bone density: 2011, osteopenia  Lipid panel: Not on file  Allergies  Allergen Reactions  . Tegretol [Carbamazepine] Itching  . Ibuprofen     Feels whoozy    Current Outpatient Prescriptions  Medication Sig Dispense Refill  . aspirin 81 MG tablet Take 81 mg by mouth daily.      Marland Kitchen DILANTIN 100 MG ER capsule Take 1 capsule (100 mg total) by mouth 3 (three) times daily. TID MWF, BID other days  1540 capsule  3  . fish oil-omega-3 fatty acids 1000 MG capsule Take 1 g by mouth daily.      Marland Kitchen glucosamine-chondroitin 500-400 MG tablet Take 1 tablet by mouth daily.      . Multiple Vitamin (MULTIVITAMIN) capsule Take 1 capsule by mouth daily.         No current facility-administered medications for this visit.    OBJECTIVE: Elderly African American woman who appears well Filed Vitals:   03/17/14 1402  BP: 133/66  Pulse: 92  Temp: 98.4 F (36.9 C)  Resp: 18     Body mass index is 23.66 kg/(m^2).    ECOG FS: 0 Filed Weights   03/17/14 1402  Weight: 129 lb 6.4 oz (58.695 kg)   Physical Exam: HEENT:  Sclerae anicteric.  Oropharynx clear, pink, and moist. Neck is supple, trachea midline. No thyromegaly.  NODES:  No cervical or supraclavicular lymphadenopathy palpated.  BREAST EXAM:  Right breast is unremarkable, and I note no abnormalities on exam. Left breast is status post lumpectomy. No suspicious nodularities or skin changes. No evidence of local recurrence. Axillae are benign bilaterally, with no palpable lymphadenopathy. LUNGS:  Clear to auscultation bilaterally.  No wheezes or rhonchi HEART:   Regular rate and rhythm. No murmur appreciated ABDOMEN:  Soft, nontender. No organomegaly or masses palpated. Positive bowel sounds.  MSK:  No focal spinal tenderness to palpation. Good range of motion bilaterally in the upper extremities.  EXTREMITIES:  No peripheral edema.  No lymphedema in the left upper extremity. No clubbing or cyanosis. SKIN:  No visible rashes. No excessive ecchymoses. No petechiae. Good skin turgor. NEURO:  Nonfocal. Well oriented.  Positive affect.     LAB RESULTS: Lab Results  Component Value Date   WBC 7.5 03/17/2014   NEUTROABS 4.2 03/17/2014   HGB 13.7 03/17/2014   HCT 43.1 03/17/2014   MCV 90.9 03/17/2014   PLT 264 03/17/2014      Chemistry      Component Value Date/Time   NA 140 03/17/2014 1333   NA 138 06/18/2013 0837   K  3.6 03/17/2014 1333   K 4.2 06/18/2013 0837   CL 105 06/18/2013 0837   CL 104 02/24/2013 1330   CO2 24 03/17/2014 1333   CO2 29 06/18/2013 0837   BUN 14.4 03/17/2014 1333   BUN 12 06/18/2013 0837   CREATININE 0.9 03/17/2014 1333   CREATININE 0.8 06/18/2013 0837      Component Value Date/Time   CALCIUM 9.2 03/17/2014 1333   CALCIUM 9.1 06/18/2013 0837   ALKPHOS 86 03/17/2014 1333   ALKPHOS 72 06/18/2013 0837   AST 16 03/17/2014 1333   AST 19 06/18/2013 0837   ALT 15 03/17/2014 1333   ALT 24 06/18/2013 0837   BILITOT 0.24 03/17/2014 1333   BILITOT 0.3 06/18/2013 0837       STUDIES:  Mm Screening Breast Tomo Bilateral 03/14/2014   CLINICAL DATA:  Screening. History of malignant excisional biopsy of both breasts in 2000. Radiation therapy of the left breast in 2000.  EXAM: DIGITAL SCREENING BILATERAL MAMMOGRAM WITH 3D TOMO WITH CAD  DIGITAL BREAST TOMOSYNTHESIS  Digital breast tomosynthesis images are acquired in two projections. These images are reviewed in combination with the digital mammogram, confirming the findings below.  COMPARISON:  Previous Exam(s)  ACR Breast Density Category b: There are scattered areas of fibroglandular density.   FINDINGS: In the right breast, calcifications warrant further evaluation with magnified views. In the left breast, no findings suspicious for malignancy. Note is again made of postsurgical scarring and dystrophic calcification at site of lumpectomy in the medial left breast. Images were processed with CAD.  IMPRESSION: Further evaluation is suggested for calcifications in the right breast.  RECOMMENDATION: Diagnostic mammogram of the right breast. (Code:[id number])  The patient will be contacted regarding the findings, and additional imaging will be scheduled.  BI-RADS CATEGORY  0: Incomplete. Need additional imaging evaluation and/or prior mammograms for comparison.   Electronically Signed   By: Andres Shad   On: 03/14/2014 15:31      ASSESSMENT: 78 y.o. [place] woman,   (1)  status post left lumpectomy and axillary lymph node sampling October of 2000 for a T1c N1(mic), stage IB invasive ductal carcinoma, grade 2, estrogen receptor 98% positive, progesterone receptor 93% positive, with no HER-2 amplification, and an MIB-1 of 28%;   (2)  status post 4 cycles of cyclophosphamide/ epirubicin,  (3)   followed by radiation,   (4)  followed by tamoxifen.   PLAN: Brandi [name]'s labs and physical exam were all unremarkable today, and she seems to be doing quite well. We will await the results of her diagnostic mammogram next week, and certainly will followup accordingly. If all is well, she would like to continue being seen on an annual basis. She will have her next annual mammogram in late May or early June of 2016, and we'll see [place] in thereafter for repeat labs and physical exam.  She knows to call us prior that time, however, with any changes or problems.    Brandi [name] Milda Smart  PA-C   03/17/2014

## 2014-03-18 ENCOUNTER — Other Ambulatory Visit: Payer: Self-pay | Admitting: Physician Assistant

## 2014-03-18 ENCOUNTER — Other Ambulatory Visit: Payer: Self-pay

## 2014-03-18 DIAGNOSIS — R928 Other abnormal and inconclusive findings on diagnostic imaging of breast: Secondary | ICD-10-CM

## 2014-03-25 ENCOUNTER — Ambulatory Visit
Admission: RE | Admit: 2014-03-25 | Discharge: 2014-03-25 | Disposition: A | Discharge status: Home / Self Care | Payer: Medicare Other | Source: Ambulatory Visit | Attending: Physician Assistant | Admitting: Physician Assistant

## 2014-03-25 DIAGNOSIS — R928 Other abnormal and inconclusive findings on diagnostic imaging of breast: Secondary | ICD-10-CM

## 2014-07-13 ENCOUNTER — Encounter: Payer: Self-pay | Admitting: Nutrition

## 2014-08-03 ENCOUNTER — Telehealth: Payer: Self-pay | Admitting: Neurology

## 2014-08-03 DIAGNOSIS — M810 Age-related osteoporosis without current pathological fracture: Secondary | ICD-10-CM

## 2014-08-03 DIAGNOSIS — R569 Unspecified convulsions: Secondary | ICD-10-CM

## 2014-08-03 DIAGNOSIS — Z5181 Encounter for therapeutic drug level monitoring: Secondary | ICD-10-CM

## 2014-08-03 DIAGNOSIS — G609 Hereditary and idiopathic neuropathy, unspecified: Secondary | ICD-10-CM

## 2014-08-03 MED ORDER — PHENYTOIN SODIUM EXTENDED 100 MG PO CAPS: 100.0000 mg | ORAL_CAPSULE | Freq: Three times a day (TID) | ORAL | Status: DC

## 2014-08-03 NOTE — Telephone Encounter (Signed)
Patient calling to state that her Dilantin script has become too expensive for her and wants to try the generic. Please return call and advise.

## 2014-08-03 NOTE — Telephone Encounter (Signed)
I understand the problem, Yes I will change her to generic.

## 2014-08-03 NOTE — Telephone Encounter (Signed)
Patient would like to know if Dr Brett Fairy would allow her to start taking generic Dilantin instead of Name Brand.  Please advise.  Thank you.

## 2014-08-03 NOTE — Telephone Encounter (Signed)
I called back, got no answer.  Left message.  

## 2014-08-04 ENCOUNTER — Telehealth: Payer: Self-pay | Admitting: Neurology

## 2014-08-04 NOTE — Telephone Encounter (Signed)
I called back.  Patient wanted to verify her Rx was sent to [place].  Advised it has.  Asked her to contact us if she notices any changes once she starts generic Dilantin (Okay by Dr Brett Fairy).  Patient verbalized understanding.

## 2014-08-04 NOTE — Telephone Encounter (Signed)
Patient calling back with additional questions.  Please return call.

## 2014-08-19 ENCOUNTER — Telehealth: Payer: Self-pay | Admitting: Neurology

## 2014-08-19 DIAGNOSIS — Z5181 Encounter for therapeutic drug level monitoring: Secondary | ICD-10-CM

## 2014-08-19 DIAGNOSIS — M810 Age-related osteoporosis without current pathological fracture: Secondary | ICD-10-CM

## 2014-08-19 DIAGNOSIS — G609 Hereditary and idiopathic neuropathy, unspecified: Secondary | ICD-10-CM

## 2014-08-19 DIAGNOSIS — R569 Unspecified convulsions: Secondary | ICD-10-CM

## 2014-08-19 MED ORDER — DILANTIN 100 MG PO CAPS: 100.0000 mg | ORAL_CAPSULE | Freq: Three times a day (TID) | ORAL | Status: DC

## 2014-08-19 NOTE — Telephone Encounter (Signed)
Patient stated she wants to change back to brand new phenytoin (DILANTIN) 100 MG ER capsule.  Please forward Rx to [facility].  Please call and advise.

## 2014-08-19 NOTE — Telephone Encounter (Signed)
Patient has decided she no longer wants to take generic Dilantin, and wants a Rx for Brand name instead.

## 2014-08-29 ENCOUNTER — Other Ambulatory Visit: Payer: Self-pay | Admitting: Oncology

## 2014-08-29 ENCOUNTER — Other Ambulatory Visit: Payer: Self-pay | Admitting: Family Medicine

## 2014-08-29 DIAGNOSIS — R921 Mammographic calcification found on diagnostic imaging of breast: Secondary | ICD-10-CM

## 2014-09-01 ENCOUNTER — Telehealth: Payer: Self-pay | Admitting: Family Medicine

## 2014-09-01 NOTE — Telephone Encounter (Signed)
Ok to switch. Thanks

## 2014-09-01 NOTE — Telephone Encounter (Signed)
Patient called wanted to switch from dr g to dr Diona Browner.  She stated she would be more comfortable with a female dr Is it ok to change providers

## 2014-09-01 NOTE — Telephone Encounter (Signed)
I do not see a reason she cannot switch as long as Dr. Darnell Level agrees.

## 2014-09-02 NOTE — Telephone Encounter (Signed)
Left message asking pt to call office see note below

## 2014-09-05 NOTE — Telephone Encounter (Signed)
Appointment 11/13 pt aware

## 2014-09-06 ENCOUNTER — Ambulatory Visit (INDEPENDENT_AMBULATORY_CARE_PROVIDER_SITE_OTHER): Payer: Medicare Other | Admitting: Adult Health

## 2014-09-06 ENCOUNTER — Encounter: Payer: Self-pay | Admitting: Adult Health

## 2014-09-06 VITALS — BP 130/80 | HR 95 | Ht 62.0 in | Wt 135.0 lb

## 2014-09-06 DIAGNOSIS — G40909 Epilepsy, unspecified, not intractable, without status epilepticus: Secondary | ICD-10-CM

## 2014-09-06 DIAGNOSIS — Z5181 Encounter for therapeutic drug level monitoring: Secondary | ICD-10-CM
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
## 2014-09-06 NOTE — Patient Instructions (Signed)
Seizure, Adult A seizure means there is unusual activity in the brain. A seizure can cause changes in attention or behavior. Seizures often cause shaking (convulsions). Seizures often last from 30 seconds to 2 minutes. HOME CARE   If you are given medicines, take them exactly as told by your doctor.  Keep all doctor visits as told.  Do not swim or drive until your doctor says it is okay.  Teach others what to do if you have a seizure. They should:  Lay you on the ground.  Put a cushion under your head.  Loosen any tight clothing around your neck.  Turn you on your side.  Stay with you until you get better. GET HELP RIGHT AWAY IF:   The seizure lasts longer than 2 to 5 minutes.  The seizure is very bad.  The person does not wake up after the seizure.  The person's attention or behavior changes. Drive the person to the emergency room or call your local emergency services (911 in [place] MAKE SURE YOU:   Understand these instructions.  Will watch your condition.  Will get help right away if you are not doing well or get worse. Document Released: 04/01/2008 Document Revised: 01/06/2012 Document Reviewed: 10/02/2011 ExitCare Patient Information 2015 [employer]. This information is not intended to replace advice given to you by your health care provider. Make sure you discuss any questions you have with your health care provider.  

## 2014-09-06 NOTE — Progress Notes (Signed)
I agree with the assessment and plan as directed by NP .The patient is known to me .   Leilanee Righetti, MD  

## 2014-09-07 LAB — CBC WITH DIFFERENTIAL
BASOS ABS: 0.1 10*3/uL (ref 0.0–0.2)
BASOS: 1 %
EOS ABS: 0.3 10*3/uL (ref 0.0–0.4)
Eos: 3 %
HCT: 40.4 % (ref 34.0–46.6)
HEMOGLOBIN: 13.7 g/dL (ref 11.1–15.9)
IMMATURE GRANS (ABS): 0 10*3/uL (ref 0.0–0.1)
Immature Granulocytes: 0 %
LYMPHS: 26 %
Lymphocytes Absolute: 2.1 10*3/uL (ref 0.7–3.1)
MCH: 29.6 pg (ref 26.6–33.0)
MCHC: 33.9 g/dL (ref 31.5–35.7)
MCV: 87 fL (ref 79–97)
MONOS ABS: 0.6 10*3/uL (ref 0.1–0.9)
Monocytes: 7 %
NEUTROS ABS: 5.1 10*3/uL (ref 1.4–7.0)
NEUTROS PCT: 63 %
Platelets: 284 10*3/uL (ref 150–379)
RBC: 4.63 x10E6/uL (ref 3.77–5.28)
RDW: 12.9 % (ref 12.3–15.4)
WBC: 8.2 10*3/uL (ref 3.4–10.8)

## 2014-09-07 LAB — COMPREHENSIVE METABOLIC PANEL
A/G RATIO: 1.4 (ref 1.1–2.5)
ALK PHOS: 87 IU/L (ref 39–117)
ALT: 19 IU/L (ref 0–32)
AST: 17 IU/L (ref 0–40)
Albumin: 4 g/dL (ref 3.5–4.8)
BUN / CREAT RATIO: 20 (ref 11–26)
BUN: 16 mg/dL (ref 8–27)
CHLORIDE: 100 mmol/L (ref 97–108)
CO2: 26 mmol/L (ref 18–29)
Calcium: 9.1 mg/dL (ref 8.7–10.3)
Creatinine, Ser: 0.79 mg/dL (ref 0.57–1.00)
GFR, EST AFRICAN AMERICAN: 84 mL/min/{1.73_m2} (ref >59)
GFR, EST NON AFRICAN AMERICAN: 73 mL/min/{1.73_m2} (ref >59)
Globulin, Total: 2.9 g/dL (ref 1.5–4.5)
Glucose: 93 mg/dL (ref 65–99)
POTASSIUM: 3.9 mmol/L (ref 3.5–5.2)
SODIUM: 138 mmol/L (ref 134–144)
TOTAL PROTEIN: 6.9 g/dL (ref 6.0–8.5)

## 2014-09-07 LAB — PHENYTOIN LEVEL, TOTAL: PHENYTOIN LVL: 16.3 ug/mL (ref 10.0–20.0)

## 2014-09-08 ENCOUNTER — Ambulatory Visit (INDEPENDENT_AMBULATORY_CARE_PROVIDER_SITE_OTHER): Payer: Medicare Other | Admitting: Gynecology

## 2014-09-08 ENCOUNTER — Other Ambulatory Visit (HOSPITAL_COMMUNITY)
Admission: RE | Admit: 2014-09-08 | Discharge: 2014-09-08 | Disposition: A | Discharge status: Home / Self Care | Payer: Medicare Other | Source: Ambulatory Visit | Attending: Gynecology | Admitting: Gynecology

## 2014-09-08 ENCOUNTER — Encounter: Payer: Self-pay | Admitting: Gynecology

## 2014-09-08 ENCOUNTER — Telehealth: Payer: Self-pay | Admitting: Neurology

## 2014-09-08 VITALS — BP 120/78 | Ht 61.0 in | Wt 134.0 lb

## 2014-09-08 DIAGNOSIS — M8580 Other specified disorders of bone density and structure, unspecified site: Secondary | ICD-10-CM

## 2014-09-08 DIAGNOSIS — Z01419 Encounter for gynecological examination (general) (routine) without abnormal findings: Secondary | ICD-10-CM | POA: Diagnosis present

## 2014-09-08 DIAGNOSIS — N952 Postmenopausal atrophic vaginitis: Secondary | ICD-10-CM

## 2014-09-08 DIAGNOSIS — N8111 Cystocele, midline: Secondary | ICD-10-CM

## 2014-09-08 DIAGNOSIS — Z124 Encounter for screening for malignant neoplasm of cervix: Secondary | ICD-10-CM

## 2014-09-08 DIAGNOSIS — L989 Disorder of the skin and subcutaneous tissue, unspecified: Secondary | ICD-10-CM

## 2014-09-08 DIAGNOSIS — C50912 Malignant neoplasm of unspecified site of left female breast: Secondary | ICD-10-CM

## 2014-09-08 NOTE — Telephone Encounter (Signed)
Patient returning a missed call from our office, believes it to be her Dilantin level lab results, please return call and advise.

## 2014-09-08 NOTE — Telephone Encounter (Signed)
I called and gave lab results to pt were all normal.  She verbalized understanding.

## 2014-09-08 NOTE — Telephone Encounter (Signed)
Error

## 2014-09-08 NOTE — Addendum Note (Signed)
Addended by: Federico Flake on: 09/08/2014 12:17 PM   Modules accepted: Orders

## 2014-09-08 NOTE — Patient Instructions (Signed)
Follow up for bone density as scheduled. Follow up to have the skin lesions excised.  You may obtain a copy of any labs that were done today by logging onto MyChart as outlined in the instructions provided with your AVS (after visit summary). The office will not call with normal lab results but certainly if there are any significant abnormalities then we will contact you.   Health Maintenance, Female A healthy lifestyle and preventative care can promote health and wellness.  Maintain regular health, dental, and eye exams.  Eat a healthy diet. Foods like vegetables, fruits, whole grains, low-fat dairy products, and lean protein foods contain the nutrients you need without too many calories. Decrease your intake of foods high in solid fats, added sugars, and salt. Get information about a proper diet from your caregiver, if necessary.  Regular physical exercise is one of the most important things you can do for your health. Most adults should get at least 150 minutes of moderate-intensity exercise (any activity that increases your heart rate and causes you to sweat) each week. In addition, most adults need muscle-strengthening exercises on 2 or more days a week.   Maintain a healthy weight. The body mass index (BMI) is a screening tool to identify possible weight problems. It provides an estimate of body fat based on height and weight. Your caregiver can help determine your BMI, and can help you achieve or maintain a healthy weight. For adults 20 years and older:  A BMI below 18.5 is considered underweight.  A BMI of 18.5 to 24.9 is normal.  A BMI of 25 to 29.9 is considered overweight.  A BMI of 30 and above is considered obese.  Maintain normal blood lipids and cholesterol by exercising and minimizing your intake of saturated fat. Eat a balanced diet with plenty of fruits and vegetables. Blood tests for lipids and cholesterol should begin at age 17 and be repeated every 5 years. If your lipid  or cholesterol levels are high, you are over 50, or you are a high risk for heart disease, you may need your cholesterol levels checked more frequently.Ongoing high lipid and cholesterol levels should be treated with medicines if diet and exercise are not effective.  If you smoke, find out from your caregiver how to quit. If you do not use tobacco, do not start.  Lung cancer screening is recommended for adults aged 95 80 years who are at high risk for developing lung cancer because of a history of smoking. Yearly low-dose computed tomography (CT) is recommended for people who have at least a 30-pack-year history of smoking and are a current smoker or have quit within the past 15 years. A pack year of smoking is smoking an average of 1 pack of cigarettes a day for 1 year (for example: 1 pack a day for 30 years or 2 packs a day for 15 years). Yearly screening should continue until the smoker has stopped smoking for at least 15 years. Yearly screening should also be stopped for people who develop a health problem that would prevent them from having lung cancer treatment.  If you are pregnant, do not drink alcohol. If you are breastfeeding, be very cautious about drinking alcohol. If you are not pregnant and choose to drink alcohol, do not exceed 1 drink per day. One drink is considered to be 12 ounces (355 mL) of beer, 5 ounces (148 mL) of wine, or 1.5 ounces (44 mL) of liquor.  Avoid use of street drugs. Do  not share needles with anyone. Ask for help if you need support or instructions about stopping the use of drugs.  High blood pressure causes heart disease and increases the risk of stroke. Blood pressure should be checked at least every 1 to 2 years. Ongoing high blood pressure should be treated with medicines, if weight loss and exercise are not effective.  If you are 72 to 76 years old, ask your caregiver if you should take aspirin to prevent strokes.  Diabetes screening involves taking a blood  sample to check your fasting blood sugar level. This should be done once every 3 years, after age 36, if you are within normal weight and without risk factors for diabetes. Testing should be considered at a younger age or be carried out more frequently if you are overweight and have at least 1 risk factor for diabetes.  Breast cancer screening is essential preventative care for women. You should practice "breast self-awareness." This means understanding the normal appearance and feel of your breasts and may include breast self-examination. Any changes detected, no matter how small, should be reported to a caregiver. Women in their 29s and 30s should have a clinical breast exam (CBE) by a caregiver as part of a regular health exam every 1 to 3 years. After age 31, women should have a CBE every year. Starting at age 51, women should consider having a mammogram (breast X-ray) every year. Women who have a family history of breast cancer should talk to their caregiver about genetic screening. Women at a high risk of breast cancer should talk to their caregiver about having an MRI and a mammogram every year.  Breast cancer gene (BRCA)-related cancer risk assessment is recommended for women who have family members with BRCA-related cancers. BRCA-related cancers include breast, ovarian, tubal, and peritoneal cancers. Having family members with these cancers may be associated with an increased risk for harmful changes (mutations) in the breast cancer genes BRCA1 and BRCA2. Results of the assessment will determine the need for genetic counseling and BRCA1 and BRCA2 testing.  The Pap test is a screening test for cervical cancer. Women should have a Pap test starting at age 34. Between ages 60 and 49, Pap tests should be repeated every 2 years. Beginning at age 44, you should have a Pap test every 3 years as long as the past 3 Pap tests have been normal. If you had a hysterectomy for a problem that was not cancer or a  condition that could lead to cancer, then you no longer need Pap tests. If you are between ages 40 and 67, and you have had normal Pap tests going back 10 years, you no longer need Pap tests. If you have had past treatment for cervical cancer or a condition that could lead to cancer, you need Pap tests and screening for cancer for at least 20 years after your treatment. If Pap tests have been discontinued, risk factors (such as a new sexual partner) need to be reassessed to determine if screening should be resumed. Some women have medical problems that increase the chance of getting cervical cancer. In these cases, your caregiver may recommend more frequent screening and Pap tests.  The human papillomavirus (HPV) test is an additional test that may be used for cervical cancer screening. The HPV test looks for the virus that can cause the cell changes on the cervix. The cells collected during the Pap test can be tested for HPV. The HPV test could be used to  screen women aged 46 years and older, and should be used in women of any age who have unclear Pap test results. After the age of 63, women should have HPV testing at the same frequency as a Pap test.  Colorectal cancer can be detected and often prevented. Most routine colorectal cancer screening begins at the age of 4 and continues through age 40. However, your caregiver may recommend screening at an earlier age if you have risk factors for colon cancer. On a yearly basis, your caregiver may provide home test kits to check for hidden blood in the stool. Use of a small camera at the end of a tube, to directly examine the colon (sigmoidoscopy or colonoscopy), can detect the earliest forms of colorectal cancer. Talk to your caregiver about this at age 48, when routine screening begins. Direct examination of the colon should be repeated every 5 to 10 years through age 102, unless early forms of pre-cancerous polyps or small growths are found.  Hepatitis C blood  testing is recommended for all people born from [birth year] through 1963 and any individual with known risks for hepatitis C.  Practice safe sex. Use condoms and avoid high-risk sexual practices to reduce the spread of sexually transmitted infections (STIs). Sexually active women aged 59 and younger should be checked for Chlamydia, which is a common sexually transmitted infection. Older women with new or multiple partners should also be tested for Chlamydia. Testing for other STIs is recommended if you are sexually active and at increased risk.  Osteoporosis is a disease in which the bones lose minerals and strength with aging. This can result in serious bone fractures. The risk of osteoporosis can be identified using a bone density scan. Women ages 11 and over and women at risk for fractures or osteoporosis should discuss screening with their caregivers. Ask your caregiver whether you should be taking a calcium supplement or vitamin D to reduce the rate of osteoporosis.  Menopause can be associated with physical symptoms and risks. Hormone replacement therapy is available to decrease symptoms and risks. You should talk to your caregiver about whether hormone replacement therapy is right for you.  Use sunscreen. Apply sunscreen liberally and repeatedly throughout the day. You should seek shade when your shadow is shorter than you. Protect yourself by wearing long sleeves, pants, a wide-brimmed hat, and sunglasses year round, whenever you are outdoors.  Notify your caregiver of new moles or changes in moles, especially if there is a change in shape or color. Also notify your caregiver if a mole is larger than the size of a pencil eraser.  Stay current with your immunizations. Document Released: 04/29/2011 Document Revised: 02/08/2013 Document Reviewed: 04/29/2011 [facility] Patient Information 2014 [place].

## 2014-09-08 NOTE — Progress Notes (Signed)
Brandi [name] 1936-06-06 [id number]        78 y.o.  G1P0010 New patient for follow up exam. Several issues noted below. Former patient of Dr. Olena Mater.  Past medical history,surgical history, problem list, medications, allergies, family history and social history were all reviewed and documented as reviewed in the EPIC chart.  ROS:  12 system ROS performed with pertinent positives and negatives included in the history, assessment and plan.   Additional significant findings :  none   Exam: Brandi [name] Vitals:   09/08/14 1050  BP: 120/78  Height: 5\' 1"  (1.549 m)  Weight: 134 lb (60.782 kg)   General appearance:  Normal affect, orientation and appearance. Skin: multiple classic seborrheic keratoses of her chest and back. One pedunculated small skin tag right neck bothersome to the patient. Crusting small lesion right upper anterior chest. HEENT: Without gross lesions.  No cervical or supraclavicular adenopathy. Thyroid normal.  Lungs:  Clear without wheezing, rales or rhonchi Cardiac: RR, without RMG Abdominal:  Soft, nontender, without masses, guarding, rebound, organomegaly or hernia Breasts:  Examined lying and sitting without masses, retractions, discharge or axillary adenopathy. Well-healed left lumpectomy scar. Pelvic:  Ext/BUS/vagina with generalized atrophic changes. First to second-degree cystocele. First-degree rectocele. Cervix well supported.  Cervix atrophic changes. Pap done  Uterus anteverted, normal size, shape and contour, midline and mobile nontender   Adnexa  Without masses or tenderness    Anus and perineum  Normal   Rectovaginal  Normal sphincter tone without palpated masses or tenderness.    Assessment/Plan:  78 y.o. G1P0010 female for follow up exam.   1. Postmenopausal/atrophic genital changes. Patient without significant symptoms of hot flashes, night sweats, vaginal dryness or dyspareunia. No bleeding. Continue to monitor. Report any vaginal  bleeding. 2. Cystocele/rectocele. Patient without symptoms of pressure or urinary issues. Discussed with patient. Will monitor and report any symptoms if they develop. 3. Osteopenia. DEXA 2011 T score -2.3. Noted to be stable from prior DEXA. FRAX 4%/0.5%. Recommend repeat DEXA now and she agrees to schedule. 4. Two skin lesions as described above that patient wants removed. Recommended follow up appointment and she agrees to schedule this. 5. Pap smear unclear when last Pap smear was done. Pap done today. No history of significant abnormal Pap smears. 6. Colonoscopy 2013. Repeat at their recommended interval. 7. Mammography 02/2014. History of breast cancer status post lumpectomy/radiation and chemotherapy 2000.  Exam NED. Continue with annual mammography. SBE monthly reviewed. 8. Health maintenance. No routine blood work done as she reports this done at her primary physician's office. Follow up for bone density and skin biopsies.     Anastasio Auerbach MD, 11:26 AM 09/08/2014

## 2014-09-08 NOTE — Addendum Note (Signed)
Addended by: Nelva Nay on: 09/08/2014 11:41 AM   Modules accepted: Orders

## 2014-09-08 NOTE — Progress Notes (Signed)
Called patient, left message of normal labs.

## 2014-09-09 ENCOUNTER — Telehealth: Payer: Self-pay

## 2014-09-09 ENCOUNTER — Ambulatory Visit: Payer: Medicare Other | Admitting: Family Medicine

## 2014-09-09 NOTE — Telephone Encounter (Signed)
To: [facility] (After Hours Triage) Fax: [phone] From: [facility] Date/ Time: 09/09/2014 7:52 AM Taken By: Theodoro Kos, CSR Caller: [place]: not collected Patient: Brandi [name], Brandi [name] DOB: 05-Feb-1936 Phone: [phone] Reason for Call: See info below Regarding Appointment: Yes Appt Date: 09/09/2014 Appt Time: 12:00:00 PM Provider: Eliezer Lofts (Family Practice) Reason: Cancel Appointment Details: Patient woke up with a very sore throat and doesn't feel well Outcome: Cancelled appointment in EPIC [facility]) Confidential

## 2014-09-12 LAB — CYTOLOGY - PAP

## 2014-09-15 ENCOUNTER — Ambulatory Visit (INDEPENDENT_AMBULATORY_CARE_PROVIDER_SITE_OTHER): Payer: Medicare Other

## 2014-09-15 ENCOUNTER — Telehealth: Payer: Self-pay | Admitting: Gynecology

## 2014-09-15 DIAGNOSIS — M8580 Other specified disorders of bone density and structure, unspecified site: Secondary | ICD-10-CM

## 2014-09-15 NOTE — Telephone Encounter (Signed)
Pt informed with the below note, pt will call back after thanksgiving holiday.

## 2014-09-15 NOTE — Telephone Encounter (Signed)
Tell patient recent bone density although appears stable from prior study she does have a higher fracture risk and I would recommend office visit so we can discuss treatment options.

## 2014-09-16 NOTE — Telephone Encounter (Signed)
See other telephone note same date

## 2014-09-19 ENCOUNTER — Ambulatory Visit: Payer: Medicare Other | Admitting: Gynecology

## 2014-09-26 ENCOUNTER — Ambulatory Visit
Admission: RE | Admit: 2014-09-26 | Discharge: 2014-09-26 | Disposition: A | Discharge status: Home / Self Care | Payer: Medicare Other | Source: Ambulatory Visit | Attending: Oncology | Admitting: Oncology

## 2014-09-26 ENCOUNTER — Other Ambulatory Visit: Payer: Self-pay | Admitting: Oncology

## 2014-09-26 DIAGNOSIS — R921 Mammographic calcification found on diagnostic imaging of breast: Secondary | ICD-10-CM

## 2014-10-07 ENCOUNTER — Ambulatory Visit
Admission: RE | Admit: 2014-10-07 | Discharge: 2014-10-07 | Disposition: A | Discharge status: Home / Self Care | Payer: Medicare Other | Source: Ambulatory Visit | Attending: Oncology | Admitting: Oncology

## 2014-10-07 DIAGNOSIS — R921 Mammographic calcification found on diagnostic imaging of breast: Secondary | ICD-10-CM

## 2014-10-09 ENCOUNTER — Other Ambulatory Visit: Payer: Self-pay | Admitting: Oncology

## 2014-12-19 ENCOUNTER — Telehealth: Payer: Self-pay | Admitting: Adult Health

## 2014-12-19 NOTE — Telephone Encounter (Signed)
Pt is calling stating that she has a rash on her left leg.  She thinks it may have come from DILANTIN 100 MG ER capsule. She states she went to pick up the medicine from [facility] and now it comes in a packet instead of a bottle.  She thinks they may have changed something.  I schedule her an appt for thie Wednesday.  She would like for someone to give her a call back.

## 2014-12-19 NOTE — Telephone Encounter (Signed)
I called the patient. She feels that her prescription of dilantin has changed. However Janett Billow called the pharmacy and the pills are the same. Patient has been on Dilantin since 1995. I doubt the rash is from the dilantin. I will have the patient come in on Wednesday and I will look at her pill bottles and at the rash.

## 2014-12-19 NOTE — Telephone Encounter (Signed)
Brandi [name] can you call pharmacy and see what changed. She needs to stay on the same Dilantin.

## 2014-12-19 NOTE — Telephone Encounter (Signed)
I called the pharmacy.  Pharmacist said the manufacturer is the same, and the pills are identical (same [facility] as well). Says the manufacturer has just started using packets instead of bottles for economical reasons, but the drug is exactly the same.

## 2014-12-21 ENCOUNTER — Ambulatory Visit: Payer: BC Managed Care – PPO | Admitting: Adult Health

## 2015-01-16 ENCOUNTER — Telehealth: Payer: Self-pay

## 2015-01-16 NOTE — Telephone Encounter (Signed)
Left message for pt to call back about flu vaccine

## 2015-03-08 ENCOUNTER — Other Ambulatory Visit: Payer: Self-pay | Admitting: Gastroenterology

## 2015-03-23 ENCOUNTER — Other Ambulatory Visit: Payer: Self-pay

## 2015-03-23 DIAGNOSIS — Z1231 Encounter for screening mammogram for malignant neoplasm of breast: Secondary | ICD-10-CM

## 2015-04-19 ENCOUNTER — Ambulatory Visit: Payer: BC Managed Care – PPO

## 2015-04-20 ENCOUNTER — Ambulatory Visit
Admission: RE | Admit: 2015-04-20 | Discharge: 2015-04-20 | Disposition: A | Discharge status: Home / Self Care | Payer: Medicare Other | Source: Ambulatory Visit

## 2015-04-20 DIAGNOSIS — Z1231 Encounter for screening mammogram for malignant neoplasm of breast: Secondary | ICD-10-CM

## 2015-09-01 ENCOUNTER — Other Ambulatory Visit: Payer: Self-pay | Admitting: Neurology

## 2015-09-08 ENCOUNTER — Ambulatory Visit: Payer: Medicare Other | Admitting: Neurology

## 2015-09-11 ENCOUNTER — Telehealth: Payer: Self-pay | Admitting: Neurology

## 2015-09-11 ENCOUNTER — Encounter: Payer: Self-pay | Admitting: Neurology

## 2015-09-11 ENCOUNTER — Ambulatory Visit (INDEPENDENT_AMBULATORY_CARE_PROVIDER_SITE_OTHER): Payer: Medicare Other | Admitting: Neurology

## 2015-09-11 VITALS — BP 108/78 | HR 86 | Resp 20 | Ht 62.75 in | Wt 130.0 lb

## 2015-09-11 DIAGNOSIS — G40209 Localization-related (focal) (partial) symptomatic epilepsy and epileptic syndromes with complex partial seizures, not intractable, without status epilepticus: Secondary | ICD-10-CM

## 2015-09-11 DIAGNOSIS — Z5181 Encounter for therapeutic drug level monitoring: Secondary | ICD-10-CM

## 2015-09-11 MED ORDER — DILANTIN 100 MG PO CAPS: ORAL_CAPSULE | ORAL | Status: DC

## 2015-09-11 NOTE — Patient Instructions (Addendum)
Phenytoin Toxicity Phenytoin is a medicine used to help prevent and treat seizures. Phenytoin toxicity, also called phenytoin poisoning, is when the level of phenytoin in a person's blood is too high. CAUSES  Phenytoin toxicity can develop if:  You take too much phenytoin.   You take a new medicine or change the dosage of an existing medicine that interferes with the breakdown of phenytoin. Some medicines can raise the amount of phenytoin in your blood.   Your metabolism changes.   You binge drink.   Phenytoin is injected into your vein too fast.  SIGNS AND SYMPTOMS  Symptoms of mild phenytoin toxicity may develop slowly over weeks or months. They can include:   Poor balance or weakness when walking.   Slow movement with all activity.   Tremors or shaking.   Irritability.   Confusion.   Loss of control of urine.   Trouble speaking or swallowing.   Double vision.   Tender and swollen gums.   Unusual pains in the arms or legs.   Abnormal hair growth.  Decreased appetite.  Nausea or vomiting.  Symptoms of severe phenytoin toxicity include:  Disorientation.  Uncontrolled, quick eye movements.   Severe confusion.  Slurred speech.  Coma.   Uncontrolled movement of the arms or legs.   Trouble breathing.   Yellowing of the skin or eyes (jaundice).   Skin rashes.   Swelling of the face.   Abdominal pain.  DIAGNOSIS  To make a diagnosis, your health care provider will order tests. These may include:   Blood tests, including a phenytoin level test and liver or kidney function tests.   A pregnancy test.   A urine test.   An electrocardiogram (ECG).   X-rays.   A CT scan.  TREATMENT  Treatment involves stopping phenytoin for a period of time. If the condition is mild, you will be asked to return to see your health care provider before you begin taking the medicine again. Your health care provider will:  Observe your  symptoms.   Order repeat blood tests.   Review your medicines.  If the condition is severe, you may need to stay at the hospital and be monitored for a period of time. You may receive IV fluids for hydration. HOME CARE INSTRUCTIONS   Take medicines only as directed by your health care provider.   Drink enough fluid to keep your urine clear or pale yellow.   Keep all follow-up visits as directed by your health care provider. This is important. SEEK MEDICAL CARE IF:   You plan to adjust the dosage of any medicines or start any new medicines. This may affect the level of phenytoin in your blood.   You have symptoms of mild phenytoin toxicity after treatment.   You have episodes of falling or unsteadiness.  Your symptoms get worse. SEEK IMMEDIATE MEDICAL CARE IF:   You fall and get injured.   You have signs or symptoms of severe phenytoin toxicity after treatment.    This information is not intended to replace advice given to you by your health care provider. Make sure you discuss any questions you have with your health care provider.   Document Released: 02/13/2007 Document Revised: 11/04/2014 Document Reviewed: 07/01/2014 Elsevier Interactive Patient Education 2016 [place] in 12 month.  Refilled dilantin BRAND NAME ONLY.   Jerrye Seebeck, MD

## 2015-09-11 NOTE — Telephone Encounter (Signed)
I advised pt that Dr. Edwena Felty license is [place] [id number] for the [facility] form. Pt verbalized understanding.

## 2015-09-11 NOTE — Telephone Encounter (Signed)
Pt called sts Dr Brett Fairy signed handicap paper but did not put her license # on. Pt is at [facility] now. Please call ASAP and advise.
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
## 2015-09-12 ENCOUNTER — Telehealth: Payer: Self-pay | Admitting: Neurology

## 2015-09-12 LAB — CBC WITH DIFFERENTIAL/PLATELET
Basophils Absolute: 0.1 10*3/uL (ref 0.0–0.2)
Basos: 1 %
EOS (ABSOLUTE): 0.3 10*3/uL (ref 0.0–0.4)
EOS: 4 %
HEMATOCRIT: 41.6 % (ref 34.0–46.6)
HEMOGLOBIN: 13.7 g/dL (ref 11.1–15.9)
Immature Grans (Abs): 0 10*3/uL (ref 0.0–0.1)
Immature Granulocytes: 0 %
LYMPHS ABS: 2.3 10*3/uL (ref 0.7–3.1)
Lymphs: 30 %
MCH: 29 pg (ref 26.6–33.0)
MCHC: 32.9 g/dL (ref 31.5–35.7)
MCV: 88 fL (ref 79–97)
MONOCYTES: 7 %
Monocytes Absolute: 0.5 10*3/uL (ref 0.1–0.9)
NEUTROS ABS: 4.4 10*3/uL (ref 1.4–7.0)
Neutrophils: 58 %
Platelets: 296 10*3/uL (ref 150–379)
RBC: 4.72 x10E6/uL (ref 3.77–5.28)
RDW: 13 % (ref 12.3–15.4)
WBC: 7.6 10*3/uL (ref 3.4–10.8)

## 2015-09-12 LAB — SPECIMEN STATUS REPORT

## 2015-09-12 NOTE — Telephone Encounter (Signed)
Patient called, is switching insurance companies. [employer] needs a letter from Dr. Brett Fairy stating that she needs DILANTIN 100 MG ER capsule and why she needs Brand vs. Generic (hasn't had a seizure since she's been taking Dilantin) and patient also request copy of this letter once it's done.

## 2015-09-13 NOTE — Telephone Encounter (Signed)
Spoke to pt and advised her that the letter is ready for pick up. Pt states that she will take it to her insurance company. I advised her that it would be ready for pick up at the front desk. Pt verbalized understanding.

## 2015-09-13 NOTE — Telephone Encounter (Signed)
Pt stopped by office and requested that the letter be sent to [place]. She was told to do this by her insurance company. Apparently, there is some kind of discount for her Dilantin if she gets this letter to them. Pt can also be reached at [phone]. I advised pt that I would fax the letter to Janett Billow, our pharmacy tech. Pt did not have a name or contact or phone number for [employer].

## 2015-09-13 NOTE — Telephone Encounter (Signed)
I called the patient back.  She is interested in the patient assistance program through [employer], wants [place] to fax them the letter Dr Brett Fairy wrote.  I asked if she had spoken with them to determine if she qualifies for assistance, but she has not contacted them.  Says her ins company told her we just needed to fax the manufacturer a letter and they would give her the meds for free, instead of the $40 co-pay she currently has.  Explained she will need to call them [employer]) to be screened for assistance to see if she qualifies.  If she does, they will send her an application to complete and return with proof of income, and we can certainly send them a copy of the letter with application.  If approved, they would would be able to provide meds at no cost.  Patient does not wish to contact the program at this time, as she feels it is not necessary.  Should she change her mind, the contact number is [phone]

## 2015-09-18 ENCOUNTER — Telehealth: Payer: Self-pay

## 2015-09-18 NOTE — Telephone Encounter (Signed)
Spoke to pt and advised her that her labs were normal. Pt verbalized understanding.

## 2015-09-18 NOTE — Telephone Encounter (Signed)
-----   Message from Larey Seat, MD sent at 09/17/2015  9:37 PM EST ----- All normal results for CMET and CBC, diff CD

## 2015-09-27 LAB — COMPREHENSIVE METABOLIC PANEL
ALBUMIN: 4.1 g/dL (ref 3.5–4.8)
ALT: 24 IU/L (ref 0–32)
AST: 25 IU/L (ref 0–40)
Albumin/Globulin Ratio: 1.3 (ref 1.1–2.5)
Alkaline Phosphatase: 90 IU/L (ref 39–117)
BUN / CREAT RATIO: 13 (ref 11–26)
BUN: 9 mg/dL (ref 8–27)
CO2: 25 mmol/L (ref 18–29)
CREATININE: 0.7 mg/dL (ref 0.57–1.00)
Calcium: 9.5 mg/dL (ref 8.7–10.3)
Chloride: 103 mmol/L (ref 97–106)
GFR calc non Af Amer: 84 mL/min/{1.73_m2} (ref >59)
GFR, EST AFRICAN AMERICAN: 97 mL/min/{1.73_m2} (ref >59)
GLUCOSE: 99 mg/dL (ref 65–99)
Globulin, Total: 3.2 g/dL (ref 1.5–4.5)
Potassium: 4.2 mmol/L (ref 3.5–5.2)
Sodium: 142 mmol/L (ref 136–144)
TOTAL PROTEIN: 7.3 g/dL (ref 6.0–8.5)

## 2015-11-16 ENCOUNTER — Telehealth: Payer: Self-pay | Admitting: Neurology

## 2015-11-16 MED ORDER — DILANTIN 100 MG PO CAPS: ORAL_CAPSULE | ORAL | Status: DC

## 2015-11-16 MED ORDER — PHENYTOIN 50 MG PO CHEW: CHEWABLE_TABLET | ORAL | Status: DC

## 2015-11-16 NOTE — Telephone Encounter (Signed)
I see in Dr. Edwena Felty notes that she has requested brand name only dilantin for patient.  Spoke to pt. She reports that Dr. Brett Fairy asked her at the last appt, if the pt had ever thought about switching to generic dilantin, and the pt states that she denied thinking about switching. However, since the BN dilantin is going to cost $74/month, she is thinking she should try generic.  She is also requesting a decrease in dosage. She wants to only take dilantin 100 mg BID for all days, instead of the MWF TID and all other days BID.  I advised her that I would send both questions to Dr. Brett Fairy and I would call her back with Dr. Edwena Felty answer.

## 2015-11-16 NOTE — Telephone Encounter (Signed)
Pt called said DILANTIN 100 MG ER capsule will cost her $74/mth. She said Dr Brett Fairy had suggested the generic and she would like to try that now. Please call pt

## 2015-11-16 NOTE — Telephone Encounter (Signed)
Due to cost reasons the patient feels that she needs to change Dilantin from brand name to generic. She is currently alternating between 200 with 300 mg daily of Dilantin use. She would like to have the same dose every day. It can be difficult to maintain seizure freedom by using a drug like phenytoin that requires protein binding. I would recommend for her to take every day 250 mg and I will cover her with two capsules of 100 mg generic  and a 50 mg chewable. Brandi [name], it is beneficial for the patient to have a trough level and a bound and unbound Dilantin level obtained this in next visit.

## 2015-11-20 MED ORDER — PHENYTOIN SODIUM EXTENDED 100 MG PO CAPS: 200.0000 mg | ORAL_CAPSULE | Freq: Every day | ORAL | Status: DC

## 2015-11-20 MED ORDER — DILANTIN 100 MG PO CAPS: ORAL_CAPSULE | ORAL | Status: DC

## 2015-11-20 MED ORDER — PHENYTOIN SODIUM EXTENDED 100 MG PO CAPS: 100.0000 mg | ORAL_CAPSULE | Freq: Three times a day (TID) | ORAL | Status: DC

## 2015-11-20 NOTE — Telephone Encounter (Signed)
Spoke to Dr. Brett Fairy. She ordered brand name dilantin 100 mg two capsules daily. Dr. Brett Fairy is ok for the generic of dilantin 100mg  two capsules daily to be taken the the generic dilating 50 mg as well.

## 2015-11-20 NOTE — Telephone Encounter (Signed)
Spoke to pt. She says that her daughter is going to help her pay for the brand name dilantin 100 mg TID MWF and BID other days. I advised her that we have already faxed the generic dilantin in to take 250 mg daily. She does not want that. She wants it to go back to how it was because her daughter will help her pay for it. I advised her that I would reorder the old way, and will inform her pharmacy to disregard the other generic dilantin RX.

## 2015-11-20 NOTE — Telephone Encounter (Signed)
Spoke with April, pharmacist at [facility]. She was advised to not fill the generic dilantin for BID and the 50 mg chewable dilantin. I advised her that I would fax a new RX for her old dilantin RX for 100mg  TID on MWF and the other days BID. April verbalized understanding and will not fill the dilantin 50mg  or the phenytoin 100 mg BID.

## 2015-11-20 NOTE — Addendum Note (Signed)
Addended by: Lester Humboldt A on: 11/20/2015 04:17 PM   Modules accepted: Orders, Medications

## 2015-11-20 NOTE — Addendum Note (Signed)
Addended by: Lester Reeltown A on: 11/20/2015 11:48 AM   Modules accepted: Orders

## 2015-11-20 NOTE — Telephone Encounter (Signed)
Pt called and says that she wants everything to stay the same. Do not change anything .

## 2015-11-20 NOTE — Telephone Encounter (Signed)
Sent the phenytoin request to Dr. Brett Fairy to approve.

## 2015-11-20 NOTE — Telephone Encounter (Signed)
Left a message on pt's cell phone per DPR, advising pt that Dr. Brett Fairy changed her dilantin to generic. She is to take 2 capsules of the 100 mg at bedtime along with a 50 mg chewable generic phenytoin. I asked her to call back with further questions. RX sent to [facility] on [address]

## 2015-12-24 ENCOUNTER — Other Ambulatory Visit: Payer: Self-pay | Admitting: Neurology

## 2015-12-25 ENCOUNTER — Telehealth: Payer: Self-pay

## 2015-12-25 NOTE — Telephone Encounter (Signed)
Received a refill request on pt's dilantin. However, it was last refilled on 11/20/2015 with three refills. I called [facility] on [address] and spoke with the pharmacist. I confirmed the RX with her and the 3 refills on file. Pt should be able to pick up her refill.

## 2015-12-28 ENCOUNTER — Other Ambulatory Visit: Payer: Self-pay | Admitting: Neurology

## 2016-03-12 ENCOUNTER — Telehealth: Payer: Self-pay | Admitting: Neurology

## 2016-03-12 NOTE — Telephone Encounter (Signed)
Patient is calling and states she got a 75 count instead of a 90 count of her Rx phenytoin 100 mg ER capsules.  Please call to clarify.  Thanks!

## 2016-03-12 NOTE — Telephone Encounter (Signed)
I spoke to pt. She claims [facility] only gave her 75 capsules of dilantin. I check her medication list, and her dilantin was last refilled on 11/20/15 for 270 capsules and 3 refills and sent to [facility] on [address]. I verified with pt that she is taking 1 capsule three times a day on MWF and 1 capules twice a daily on TuTHSatSun. Pt says that this is how she is taking it. I advised her that she needs to call her [place] to solve this problem. I reiterated several times to her that her RX has not changed and it has not been reordered since 11/20/2015 and I'm not sure why she was given 75 capsules. She says she usually gets 90 capsules, but I advised her that Dr. Brett Fairy has ordered 270 capsules for her, so both 90 and 75 capsules dispensed is incorrect. Pt will call her [place] to discuss.

## 2016-03-18 ENCOUNTER — Other Ambulatory Visit: Payer: Self-pay

## 2016-03-18 DIAGNOSIS — Z1231 Encounter for screening mammogram for malignant neoplasm of breast: Secondary | ICD-10-CM

## 2016-04-03 ENCOUNTER — Telehealth: Payer: Self-pay | Admitting: Neurology

## 2016-04-03 NOTE — Telephone Encounter (Signed)
I spoke to pt. She wants a letter faxed to [facility] saying that brand name dilantin is medically necessary and she needs it cheaper than $72/month. I see that a letter has been already written for [employer], but pt says she has changed insurances. Pt is insisting that fax a letter to [place] telling them that she must take brand name dilantin and $72 is too expensive. Pt does not know her UHC id number. She can only give me a phone number to [facility]- [phone]. I call this number. It is actually a dental plan. I was transferred to medical. However, since I don't have the pt ID number, I was not able to be helped.   I called pt and advised her that without her member ID, UHC would not help me. Pt found her ID. ID: [id number] Group #:  [id number]  I called [employer]HC. I was transferred three times. Finally, I spoke to someone who advised me that they cannot make dilantin brand name any cheaper. Pt should try contacting the manufacturer for coupons or patient assistance.  Hinton Dyer, can you please find out if there is any assistance available for this pt's brand name dilantin? She is saying that $72/month is too expensive. I have contacted her insurance and was told that they cannot make it any cheaper. Please call the pt.

## 2016-04-03 NOTE — Telephone Encounter (Signed)
Pt called requesting a letter be sent to her new insurance [place]. Stating why she take brand name phenytoin (DILANTIN) 100 MG ER capsule. May call the pt at [phone]

## 2016-04-10 ENCOUNTER — Telehealth: Payer: Self-pay

## 2016-04-10 MED ORDER — PHENYTOIN SODIUM EXTENDED 100 MG PO CAPS: 100.0000 mg | ORAL_CAPSULE | Freq: Three times a day (TID) | ORAL | Status: DC

## 2016-04-10 NOTE — Telephone Encounter (Signed)
Spoke to patient she will bring me proof of income sometime this week or next week. I will have form  completed so I can submit when she bring's me proof of income . Relayed to patient that this may take a month turn around time. Also relayed to patient her next refill if patient assistance is not back please use her $20.00 of coupon for her dilantin. Patient understood what I was relaying to her.

## 2016-04-10 NOTE — Telephone Encounter (Signed)
All information has been faxed to Patient assistance program [place] [phone] fax [phone] Phenytoin Dilantin 100 mg

## 2016-04-10 NOTE — Telephone Encounter (Signed)
Pt assistance forms filled out for [employer]. They need a printed RX for dilantin.

## 2016-04-24 NOTE — Telephone Encounter (Signed)
Dilantin was delivered to the office from [facility] they sent three bottles of Dilantin 100 mg. Relayed to patient office hours and she will come pick from front desk.

## 2016-04-25 ENCOUNTER — Ambulatory Visit
Admission: RE | Admit: 2016-04-25 | Discharge: 2016-04-25 | Disposition: A | Discharge status: Home / Self Care | Payer: Medicare Other | Source: Ambulatory Visit

## 2016-04-25 DIAGNOSIS — Z1231 Encounter for screening mammogram for malignant neoplasm of breast: Secondary | ICD-10-CM

## 2016-05-02 NOTE — Telephone Encounter (Signed)
Received notice from [place] that pt has been accepted into [employer] patient assistance program until 10/27/2016. Dilantin Extended oral capsules 100mg , 3 bottles, has been shipped to our office.  During pt's enrollment period, our office will be responsible for placing reorders for the pt. To reorder medication, call [employer] at [phone] and follow the prompts. For any questions during pt's enrollment, cal [employer] at [phone].

## 2016-05-07 NOTE — Telephone Encounter (Signed)
Patient called to advise, she received letter stating she has been approved for patient assistance for Dilantin through [employer]. Effective 04/19/16 through 10/27/16.

## 2016-08-14 DIAGNOSIS — Z23 Encounter for immunization: Secondary | ICD-10-CM | POA: Diagnosis not present

## 2016-08-19 NOTE — Telephone Encounter (Addendum)
Patient is calling regarding free phenytoin (DILANTIN) 100 MG ER capsule. She is on her last bottle and wants to know what she should do. I gave her the number for patient assistance with [place] but she would like a returned call. She says medication is sent to our office.

## 2016-08-21 NOTE — Telephone Encounter (Signed)
Called and spoke to [employer] [phone]. Patient 's will be shipped here to the office in 7-10 business days . I have called Patient with details and she understood.  Patient will be waiting for my call for pick up.  phenytoin (DILANTIN) 100 MG ER capsule 270 capsule 3 04/10/2016    Sig - Route: Take 1 capsule (100 mg total) by mouth 3 (three) times daily. Take 1 capsule by mouth three times daily on MWF. All other days, take BID. - Oral

## 2016-08-26 ENCOUNTER — Telehealth: Payer: Self-pay | Admitting: Neurology

## 2016-08-26 NOTE — Telephone Encounter (Signed)
Called and relayed to patient that her Patient assistance RX was here Dilantin 100 mg [employer]. Patient understood details. Patient will pick up at the front desk.

## 2016-09-10 ENCOUNTER — Encounter: Payer: Self-pay | Admitting: Adult Health

## 2016-09-10 ENCOUNTER — Ambulatory Visit (INDEPENDENT_AMBULATORY_CARE_PROVIDER_SITE_OTHER): Payer: Medicare Other | Admitting: Adult Health

## 2016-09-10 VITALS — BP 123/71 | HR 102 | Resp 20 | Ht 61.0 in | Wt 124.0 lb

## 2016-09-10 DIAGNOSIS — Z5181 Encounter for therapeutic drug level monitoring: Secondary | ICD-10-CM

## 2016-09-10 DIAGNOSIS — R569 Unspecified convulsions: Secondary | ICD-10-CM
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
## 2016-09-10 NOTE — Progress Notes (Signed)
I agree with the assessment and plan as directed by NP - I was aviaiable for consultation if needed. CDohmeier.

## 2016-09-10 NOTE — Patient Instructions (Signed)
Continue Dilantin Blood work today If you have any seizures let us know

## 2016-09-11 ENCOUNTER — Telehealth: Payer: Self-pay

## 2016-09-11 LAB — PHENYTOIN LEVEL, TOTAL: Phenytoin (Dilantin), Serum: 12.2 ug/mL (ref 10.0–20.0)

## 2016-09-11 LAB — COMPREHENSIVE METABOLIC PANEL
ALBUMIN: 3.9 g/dL (ref 3.5–4.8)
ALK PHOS: 96 IU/L (ref 39–117)
ALT: 21 IU/L (ref 0–32)
AST: 18 IU/L (ref 0–40)
Albumin/Globulin Ratio: 1.3 (ref 1.2–2.2)
BUN / CREAT RATIO: 14 (ref 12–28)
BUN: 10 mg/dL (ref 8–27)
Bilirubin Total: 0.2 mg/dL (ref 0.0–1.2)
CO2: 25 mmol/L (ref 18–29)
CREATININE: 0.7 mg/dL (ref 0.57–1.00)
Calcium: 9.1 mg/dL (ref 8.7–10.3)
Chloride: 100 mmol/L (ref 96–106)
GFR calc Af Amer: 96 mL/min/{1.73_m2} (ref >59)
GFR calc non Af Amer: 83 mL/min/{1.73_m2} (ref >59)
GLUCOSE: 104 mg/dL — AB (ref 65–99)
Globulin, Total: 3 g/dL (ref 1.5–4.5)
Potassium: 3.6 mmol/L (ref 3.5–5.2)
Sodium: 141 mmol/L (ref 134–144)
TOTAL PROTEIN: 6.9 g/dL (ref 6.0–8.5)

## 2016-09-11 LAB — CBC WITH DIFFERENTIAL/PLATELET
Basophils Absolute: 0 10*3/uL (ref 0.0–0.2)
Basos: 1 %
EOS (ABSOLUTE): 0.1 10*3/uL (ref 0.0–0.4)
EOS: 2 %
HEMATOCRIT: 41.1 % (ref 34.0–46.6)
HEMOGLOBIN: 13.5 g/dL (ref 11.1–15.9)
IMMATURE GRANULOCYTES: 0 %
Immature Grans (Abs): 0 10*3/uL (ref 0.0–0.1)
LYMPHS ABS: 1.6 10*3/uL (ref 0.7–3.1)
Lymphs: 23 %
MCH: 29 pg (ref 26.6–33.0)
MCHC: 32.8 g/dL (ref 31.5–35.7)
MCV: 88 fL (ref 79–97)
MONOCYTES: 5 %
Monocytes Absolute: 0.4 10*3/uL (ref 0.1–0.9)
Neutrophils Absolute: 4.6 10*3/uL (ref 1.4–7.0)
Neutrophils: 69 %
Platelets: 298 10*3/uL (ref 150–379)
RBC: 4.65 x10E6/uL (ref 3.77–5.28)
RDW: 12.8 % (ref 12.3–15.4)
WBC: 6.7 10*3/uL (ref 3.4–10.8)

## 2016-09-11 NOTE — Telephone Encounter (Signed)
-----   Message from Ward Givens, NP sent at 09/11/2016  8:03 AM EST ----- Lab work unremarkable. Please call patient

## 2016-09-11 NOTE — Telephone Encounter (Signed)
Rn call patient about her lab work. Rn stated the lab work was unremarkable. Pt verbalized understanding.

## 2016-10-11 DIAGNOSIS — L0292 Furuncle, unspecified: Secondary | ICD-10-CM | POA: Diagnosis not present

## 2016-11-04 ENCOUNTER — Other Ambulatory Visit: Payer: Self-pay | Admitting: Family Medicine

## 2016-11-04 DIAGNOSIS — N6320 Unspecified lump in the left breast, unspecified quadrant: Secondary | ICD-10-CM

## 2016-11-04 DIAGNOSIS — N907 Vulvar cyst: Secondary | ICD-10-CM | POA: Diagnosis not present

## 2016-11-12 ENCOUNTER — Ambulatory Visit
Admission: RE | Admit: 2016-11-12 | Discharge: 2016-11-12 | Disposition: A | Discharge status: Home / Self Care | Payer: Medicare Other | Source: Ambulatory Visit | Attending: Family Medicine | Admitting: Family Medicine

## 2016-11-12 DIAGNOSIS — N6320 Unspecified lump in the left breast, unspecified quadrant: Secondary | ICD-10-CM

## 2016-11-12 DIAGNOSIS — N6489 Other specified disorders of breast: Secondary | ICD-10-CM | POA: Diagnosis not present

## 2016-11-12 DIAGNOSIS — R921 Mammographic calcification found on diagnostic imaging of breast: Secondary | ICD-10-CM | POA: Diagnosis not present

## 2016-12-23 ENCOUNTER — Other Ambulatory Visit: Payer: Self-pay | Admitting: Adult Health

## 2016-12-23 NOTE — Telephone Encounter (Signed)
Pt request refill for phenytoin (DILANTIN) 100 MG ER capsule

## 2016-12-23 NOTE — Telephone Encounter (Signed)
Is pt on pt assistance?

## 2016-12-24 ENCOUNTER — Other Ambulatory Visit: Payer: Self-pay | Admitting: Adult Health

## 2016-12-24 MED ORDER — PHENYTOIN SODIUM EXTENDED 100 MG PO CAPS: 100.0000 mg | ORAL_CAPSULE | Freq: Three times a day (TID) | ORAL | 3 refills | Status: DC

## 2016-12-24 MED ORDER — PHENYTOIN SODIUM EXTENDED 100 MG PO CAPS: 100.0000 mg | ORAL_CAPSULE | Freq: Three times a day (TID) | ORAL | 0 refills | Status: DC

## 2016-12-24 NOTE — Telephone Encounter (Signed)
Brandi [name] can you please call in Dilantin for patient for 10 days she is going to pay out of pocket for the ten days. Patient wants called into [facility] . Ten days only.   [employer] Patient assistance has been trying to reach patient for Three weeks all information has been sent. Patient assistance takes 7-10 days for process.

## 2016-12-24 NOTE — Telephone Encounter (Signed)
[employer] tried to reach Patient three weeks ago with no response. New application Has to be submitted . Lovey Newcomer will you print me RX for her Dilantin Please thanks Hinton Dyer.

## 2016-12-24 NOTE — Telephone Encounter (Signed)
Pt called back said she will be out of the medication today. She is on PAP.

## 2016-12-24 NOTE — Addendum Note (Signed)
Addended byOliver Hum on: 12/24/2016 11:54 AM   Modules accepted: Orders

## 2016-12-24 NOTE — Telephone Encounter (Signed)
Fax confirmation to [facility]. (confirmed pharm with Sandi C. As pt at the pharmacy waiting).

## 2016-12-24 NOTE — Telephone Encounter (Signed)
Placed prescription for 14 days, forwarded to MM/NP for print and sign.

## 2017-01-09 ENCOUNTER — Other Ambulatory Visit: Payer: Self-pay | Admitting: *Deleted

## 2017-01-09 MED ORDER — PHENYTOIN SODIUM EXTENDED 100 MG PO CAPS: 100.0000 mg | ORAL_CAPSULE | Freq: Three times a day (TID) | ORAL | 0 refills | Status: DC

## 2017-01-09 NOTE — Telephone Encounter (Signed)
error 

## 2017-01-09 NOTE — Telephone Encounter (Signed)
Still waiting on PAP for dilantin.  Sent in another refill for 10-14 days.

## 2017-01-09 NOTE — Telephone Encounter (Addendum)
Pt called checking on status of application. Pt also says she has 2 dilantin tabs left and will need refill sent to [facility] today

## 2017-01-09 NOTE — Telephone Encounter (Signed)
Cancelled the dilantin prescription at local pharmacy as her dilantin from PAP here.  She is to pick up this afternoon per Andee Poles.

## 2017-01-09 NOTE — Telephone Encounter (Signed)
I called to check the stats of PAP. [phone].

## 2017-01-09 NOTE — Telephone Encounter (Addendum)
error 

## 2017-01-09 NOTE — Telephone Encounter (Signed)
PAP is completed medication has been delivered. I called the patient back and let her know it was ready to be picked up.

## 2017-01-10 NOTE — Telephone Encounter (Signed)
Did not fax the prescription for the phenytoin (as PAP was delivered and pt to pick up).

## 2017-01-10 NOTE — Telephone Encounter (Signed)
Noted, thank you

## 2017-02-17 DIAGNOSIS — M545 Low back pain: Secondary | ICD-10-CM | POA: Diagnosis not present

## 2017-03-14 ENCOUNTER — Other Ambulatory Visit: Payer: Self-pay | Admitting: Family Medicine

## 2017-03-14 DIAGNOSIS — Z1231 Encounter for screening mammogram for malignant neoplasm of breast: Secondary | ICD-10-CM

## 2017-04-25 ENCOUNTER — Other Ambulatory Visit: Payer: Self-pay | Admitting: Adult Health

## 2017-04-25 NOTE — Telephone Encounter (Signed)
Patient called office requesting refill for phenytoin (DILANTIN) 100 MG ER capsule. Patient assistant program.  Please call

## 2017-04-28 ENCOUNTER — Ambulatory Visit
Admission: RE | Admit: 2017-04-28 | Discharge: 2017-04-28 | Disposition: A | Discharge status: Home / Self Care | Payer: Medicare Other | Source: Ambulatory Visit | Attending: Family Medicine | Admitting: Family Medicine

## 2017-04-28 DIAGNOSIS — Z1231 Encounter for screening mammogram for malignant neoplasm of breast: Secondary | ICD-10-CM

## 2017-04-28 HISTORY — DX: Personal history of antineoplastic chemotherapy: Z92.21

## 2017-04-28 HISTORY — DX: Personal history of irradiation: Z92.3

## 2017-04-28 NOTE — Telephone Encounter (Signed)
Waiting for [employer] to call me back do to computer problems.

## 2017-04-28 NOTE — Telephone Encounter (Signed)
Called [employer] Patient assistance program delivery will be here 7-10 business days.

## 2017-05-09 ENCOUNTER — Telehealth: Payer: Self-pay | Admitting: Adult Health

## 2017-05-09 NOTE — Telephone Encounter (Signed)
Called patient and left her a message relaying Dilantin 100 mg Patient assistance was here to pick up . 5 bottles . [employer] patient assistance program . Relayed office hours on voice mail.   Please call me when you have three weeks left on your prescription. In order for me to get medication shipped, I need to know three weeks before because prescription takes 7-10 business days for Delivery.  Thanks Rance Muir  Patient assistance program [facility]. [phone]

## 2017-06-09 ENCOUNTER — Other Ambulatory Visit: Payer: Self-pay | Admitting: Family Medicine

## 2017-06-09 DIAGNOSIS — E7800 Pure hypercholesterolemia, unspecified: Secondary | ICD-10-CM | POA: Diagnosis not present

## 2017-06-09 DIAGNOSIS — N6320 Unspecified lump in the left breast, unspecified quadrant: Secondary | ICD-10-CM | POA: Diagnosis not present

## 2017-06-09 DIAGNOSIS — N63 Unspecified lump in unspecified breast: Secondary | ICD-10-CM

## 2017-06-09 DIAGNOSIS — Z0000 Encounter for general adult medical examination without abnormal findings: Secondary | ICD-10-CM | POA: Diagnosis not present

## 2017-06-12 ENCOUNTER — Ambulatory Visit: Payer: Medicare Other

## 2017-06-12 ENCOUNTER — Ambulatory Visit
Admission: RE | Admit: 2017-06-12 | Discharge: 2017-06-12 | Disposition: A | Discharge status: Home / Self Care | Payer: Medicare Other | Source: Ambulatory Visit | Attending: Family Medicine | Admitting: Family Medicine

## 2017-06-12 DIAGNOSIS — R928 Other abnormal and inconclusive findings on diagnostic imaging of breast: Secondary | ICD-10-CM | POA: Diagnosis not present

## 2017-06-12 DIAGNOSIS — N63 Unspecified lump in unspecified breast: Secondary | ICD-10-CM

## 2017-07-25 DIAGNOSIS — Z23 Encounter for immunization: Secondary | ICD-10-CM | POA: Diagnosis not present

## 2017-08-28 ENCOUNTER — Other Ambulatory Visit: Payer: Self-pay | Admitting: Neurology

## 2017-08-28 NOTE — Telephone Encounter (Signed)
[place] for refill for Dilantin .  Patient is aware we need to renew the end of November . This shipment is patient's last refill.

## 2017-08-28 NOTE — Telephone Encounter (Signed)
Patient states she has a bottle and a little bit left of Dilantin and needs a refill on it.

## 2017-09-11 ENCOUNTER — Ambulatory Visit: Payer: Medicare Other | Admitting: Neurology

## 2017-09-12 NOTE — Telephone Encounter (Signed)
Brandi [name] when She comes for her apt on 09/16/2017 after you and Dr. Brett Fairy finish will you bring her buy to see me its time for her to renew her Dilantin patient assistance for 2019. I have medication for her as well. Thanks Hinton Dyer.

## 2017-09-16 ENCOUNTER — Encounter: Payer: Self-pay | Admitting: Neurology

## 2017-09-16 ENCOUNTER — Ambulatory Visit (INDEPENDENT_AMBULATORY_CARE_PROVIDER_SITE_OTHER): Payer: Medicare Other | Admitting: Neurology

## 2017-09-16 VITALS — BP 127/74 | HR 103 | Ht 61.0 in | Wt 125.0 lb

## 2017-09-16 DIAGNOSIS — G629 Polyneuropathy, unspecified: Secondary | ICD-10-CM

## 2017-09-16 DIAGNOSIS — Z5181 Encounter for therapeutic drug level monitoring: Secondary | ICD-10-CM | POA: Diagnosis not present

## 2017-09-16 DIAGNOSIS — R569 Unspecified convulsions: Secondary | ICD-10-CM

## 2017-09-16 MED ORDER — PHENYTOIN SODIUM EXTENDED 100 MG PO CAPS: 100.0000 mg | ORAL_CAPSULE | Freq: Three times a day (TID) | ORAL | 11 refills | Status: DC
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
## 2017-09-16 NOTE — Patient Instructions (Signed)
Phenytoin capsules and extended-release capsules What is this medicine? PHENYTOIN (FEN i toyn) is used to control seizures in certain types of epilepsy. It is also used to prevent seizures during or after surgery. This medicine may be used for other purposes; ask your health care provider or pharmacist if you have questions. COMMON BRAND NAME(S): Dilantin, Phenytek What should I tell my health care provider before I take this medicine? They need to know if you have any of these conditions: -an alcohol abuse problem -Asian ancestry -blood disorders or disease -diabetes -heart problems -kidney disease -liver disease -porphyria -receiving radiation therapy -suicidal thoughts, plans, or attempt; a previous suicide attempt by you or a family member -thyroid disease -an unusual or allergic reaction to phenytoin, other medicines, foods, dyes, or preservatives -pregnant or trying to get pregnant -breast-feeding How should I use this medicine? Take this medicine by mouth with a glass of water. Follow the directions on the prescription label. If you are taking extended-release capsules, swallow them whole. Do not crush or chew. Take this medicine with food if it upsets your stomach. It may be best to take your medicine consistently with or without food. Take your doses at regular intervals. Do not take your medicine more often than directed. Do not stop taking this medicine suddenly. This increases the risk of seizures. Your doctor will tell you how much medicine to take. If your doctor wants you to stop the medicine, the dose will be slowly lowered over time to avoid any side effects. Talk to your pediatrician regarding the use of this medicine in children. Special care may be needed. Overdosage: If you think you have taken too much of this medicine contact a poison control center or emergency room at once. NOTE: This medicine is only for you. Do not share this medicine with others. What if I miss a  dose? If you miss a dose, take it as soon as you can. If it is almost time for your next dose, take only that dose. Do not take double or extra doses. What may interact with this medicine? Do not take this medicine with any of the following medications: -delavirdine -ibrutinib -ranolazine This medicine may also interact with the following medications: -albendazole -alcohol -antiviral medicines for HIV or AIDS -aspirin and aspirin-like medicines -certain medicines for blood pressure like nifedipine, nimodipine, and verapamil -certain medicines for cancer -certain medicines for cholesterol like atorvastatin, simvastatin, and fluvastatin -certain medicines for depression, anxiety, or psychotic disturbances -certain medicines for fungal infections like ketoconazole and itraconazole -certain medicines for irregular heart beat like amiodarone and quinidine -certain medicines for seizures like carbamazepine, phenobarbital, and topiramate -certain medicines for stomach problems like cimetidine and omeprazole -chloramphenicol -cyclosporine -diazoxide -digoxin -disulfiram -doxycycline -female hormones, like estrogens and birth control pills -furosemide -halothane -isoniazid -medicines that relax muscles for surgery -methylphenidate -narcotic medicines for pain -phenothiazines like chlorpromazine, mesoridazine, prochlorperazine, thioridazine -praziquantel -reserpine -rifampin -St. John's Wort -steroid medicines like prednisone or cortisone -sulfonamides like sulfamethoxazole or sulfasalazine -supplements like folic acid or vitamin D -theophylline -ticlopidine -tolbutamide -warfarin This list may not describe all possible interactions. Give your health care provider a list of all the medicines, herbs, non-prescription drugs, or dietary supplements you use. Also tell them if you smoke, drink alcohol, or use illegal drugs. Some items may interact with your medicine. What should I  watch for while using this medicine? Visit your doctor or health care professional for regular checks on your progress. This medicine needs careful monitoring. Your doctor or   health care professional may schedule regular blood tests. Wear a medical ID bracelet or chain, and carry a card that describes your disease and details of your medicine and dosage times. Do not change brands or dosage forms of this medicine without discussing the change with your doctor or health care professional. You may get drowsy or dizzy. Do not drive, use machinery, or do anything that needs mental alertness until you know how this medicine affects you. Do not stand or sit up quickly, especially if you are an older patient. This reduces the risk of dizzy or fainting spells. Alcohol may interfere with the effect of this medicine. Avoid alcoholic drinks. Birth control pills may not work properly while you are taking this medicine. Talk to your doctor about using an extra method of birth control. This medicine can cause unusual growth of gum tissues. Visit your dentist regularly. Problems can arise if you need dental work, and in the day to day care of your teeth. Try to avoid damage to your teeth and gums when you brush or floss your teeth. Do not take antacids at the same time as this medicine. If you get an upset stomach and want to take an antacid or medicine for diarrhea, make sure there is an interval of 2 to 3 hours before or after you took your phenytoin. The use of this medicine may increase the chance of suicidal thoughts or actions. Pay special attention to how you are responding while on this medicine. Any worsening of mood, or thoughts of suicide or dying should be reported to your health care professional right away. Women who become pregnant while using this medicine may enroll in the [place] American Antiepileptic Drug Pregnancy Registry by calling 1-888-233-2334. This registry collects information about the safety of  antiepileptic drug use during pregnancy. What side effects may I notice from receiving this medicine? Side effects that you should report to your doctor or health care professional as soon as possible: -allergic reactions like skin rash, itching or hives, swelling of the face, lips, or tongue -breathing problems -changes in vision -chest pain or tightness -confusion -dark yellow or brown urine -fast or irregular heartbeat -fever, sore throat -headache -loss of seizure control -poor control of body movements or difficulty walking -redness, blistering, peeling or loosening of the skin, including inside the mouth -unusual bleeding or bruising, pinpoint red spots on skin -vomiting -worsening of mood, thoughts or actions of suicide or dying -yellowing of the eyes or skin Side effects that usually do not require medical attention (report to your doctor or health care professional if they continue or are bothersome): -constipation -difficulty sleeping -excessive hair growth on the face or body -nausea This list may not describe all possible side effects. Call your doctor for medical advice about side effects. You may report side effects to FDA at 1-800-FDA-1088. Where should I keep my medicine? Keep out of the reach of children. Store at room temperature between 15 and 30 degrees C (59 and 86 degrees F). Protect from light and moisture. Throw away any unused medicine after the expiration date. NOTE: This sheet is a summary. It may not cover all possible information. If you have questions about this medicine, talk to your doctor, pharmacist, or health care provider.  2018 Elsevier/Gold Standard (2013-05-10 15:07:05)  

## 2017-09-17 LAB — CBC WITH DIFFERENTIAL/PLATELET
BASOS: 1 %
Basophils Absolute: 0.1 10*3/uL (ref 0.0–0.2)
EOS (ABSOLUTE): 0.3 10*3/uL (ref 0.0–0.4)
Eos: 4 %
Hematocrit: 39.5 % (ref 34.0–46.6)
Hemoglobin: 12.6 g/dL (ref 11.1–15.9)
IMMATURE GRANULOCYTES: 0 %
Immature Grans (Abs): 0 10*3/uL (ref 0.0–0.1)
LYMPHS: 32 %
Lymphocytes Absolute: 2.7 10*3/uL (ref 0.7–3.1)
MCH: 28.8 pg (ref 26.6–33.0)
MCHC: 31.9 g/dL (ref 31.5–35.7)
MCV: 90 fL (ref 79–97)
MONOS ABS: 0.6 10*3/uL (ref 0.1–0.9)
Monocytes: 8 %
Neutrophils Absolute: 4.6 10*3/uL (ref 1.4–7.0)
Neutrophils: 55 %
PLATELETS: 297 10*3/uL (ref 150–379)
RBC: 4.37 x10E6/uL (ref 3.77–5.28)
RDW: 13.3 % (ref 12.3–15.4)
WBC: 8.3 10*3/uL (ref 3.4–10.8)

## 2017-09-17 LAB — COMPREHENSIVE METABOLIC PANEL
A/G RATIO: 1.4 (ref 1.2–2.2)
ALT: 17 IU/L (ref 0–32)
AST: 21 IU/L (ref 0–40)
Albumin: 4.2 g/dL (ref 3.5–4.8)
Alkaline Phosphatase: 86 IU/L (ref 39–117)
BUN/Creatinine Ratio: 17 (ref 12–28)
BUN: 14 mg/dL (ref 8–27)
Bilirubin Total: <0.2 mg/dL (ref 0.0–1.2)
CALCIUM: 9 mg/dL (ref 8.7–10.3)
CHLORIDE: 100 mmol/L (ref 96–106)
CO2: 27 mmol/L (ref 20–29)
Creatinine, Ser: 0.84 mg/dL (ref 0.57–1.00)
GFR calc Af Amer: 76 mL/min/{1.73_m2} (ref >59)
GFR, EST NON AFRICAN AMERICAN: 66 mL/min/{1.73_m2} (ref >59)
Globulin, Total: 3 g/dL (ref 1.5–4.5)
Glucose: 83 mg/dL (ref 65–99)
POTASSIUM: 4.2 mmol/L (ref 3.5–5.2)
Sodium: 140 mmol/L (ref 134–144)
Total Protein: 7.2 g/dL (ref 6.0–8.5)

## 2017-09-17 LAB — PHENYTOIN LEVEL, TOTAL: Phenytoin (Dilantin), Serum: 19.4 ug/mL (ref 10.0–20.0)

## 2017-09-22 ENCOUNTER — Telehealth: Payer: Self-pay | Admitting: Neurology

## 2017-09-22 NOTE — Telephone Encounter (Signed)
Called and let the patient know that her labs were normal and she should continue to take her Dilantin. Pt verbalized understanding. Pt had no questions at this time but was encouraged to call back if questions arise.

## 2017-09-22 NOTE — Telephone Encounter (Signed)
-----   Message from Larey Seat, MD sent at 09/17/2017  8:39 PM EST ----- Very normal labs - metabolic, blood cell count and phenytoin level. Myriam Jacobson,  please call Monday after Thanksgiving. CD

## 2017-09-29 ENCOUNTER — Telehealth: Payer: Self-pay | Admitting: Neurology

## 2017-09-29 NOTE — Telephone Encounter (Signed)
Sent in Patient's renewal for 2019 Dilantin for Patient assistance [place] Telephone [phone] .

## 2017-10-02 ENCOUNTER — Other Ambulatory Visit: Payer: Self-pay | Admitting: Family Medicine

## 2017-10-02 DIAGNOSIS — Z139 Encounter for screening, unspecified: Secondary | ICD-10-CM

## 2017-11-14 ENCOUNTER — Ambulatory Visit
Admission: RE | Admit: 2017-11-14 | Discharge: 2017-11-14 | Disposition: A | Discharge status: Home / Self Care | Payer: Medicare HMO | Source: Ambulatory Visit | Attending: Family Medicine | Admitting: Family Medicine

## 2017-11-14 DIAGNOSIS — Z1231 Encounter for screening mammogram for malignant neoplasm of breast: Secondary | ICD-10-CM | POA: Diagnosis not present

## 2017-11-14 DIAGNOSIS — Z139 Encounter for screening, unspecified: Secondary | ICD-10-CM

## 2018-01-29 DIAGNOSIS — L720 Epidermal cyst: Secondary | ICD-10-CM | POA: Diagnosis not present

## 2018-01-29 DIAGNOSIS — R195 Other fecal abnormalities: Secondary | ICD-10-CM | POA: Diagnosis not present

## 2018-02-02 DIAGNOSIS — R195 Other fecal abnormalities: Secondary | ICD-10-CM | POA: Diagnosis not present

## 2018-02-02 DIAGNOSIS — L720 Epidermal cyst: Secondary | ICD-10-CM | POA: Diagnosis not present

## 2018-03-10 ENCOUNTER — Other Ambulatory Visit: Payer: Self-pay | Admitting: Family Medicine

## 2018-03-10 DIAGNOSIS — N644 Mastodynia: Secondary | ICD-10-CM

## 2018-03-12 ENCOUNTER — Ambulatory Visit
Admission: RE | Admit: 2018-03-12 | Discharge: 2018-03-12 | Disposition: A | Discharge status: Home / Self Care | Payer: Medicare HMO | Source: Ambulatory Visit | Attending: Family Medicine | Admitting: Family Medicine

## 2018-03-12 DIAGNOSIS — N644 Mastodynia: Secondary | ICD-10-CM

## 2018-03-12 DIAGNOSIS — N6489 Other specified disorders of breast: Secondary | ICD-10-CM | POA: Diagnosis not present

## 2018-03-12 DIAGNOSIS — R928 Other abnormal and inconclusive findings on diagnostic imaging of breast: Secondary | ICD-10-CM | POA: Diagnosis not present

## 2018-04-26 ENCOUNTER — Emergency Department (HOSPITAL_COMMUNITY)
Admission: EM | Admit: 2018-04-26 | Discharge: 2018-04-26 | Disposition: A | Discharge status: Home / Self Care | Payer: Medicare HMO | Attending: Emergency Medicine | Admitting: Emergency Medicine

## 2018-04-26 ENCOUNTER — Encounter (HOSPITAL_COMMUNITY): Payer: Self-pay | Admitting: Emergency Medicine

## 2018-04-26 DIAGNOSIS — Y93H2 Activity, gardening and landscaping: Secondary | ICD-10-CM | POA: Diagnosis not present

## 2018-04-26 DIAGNOSIS — L03116 Cellulitis of left lower limb: Secondary | ICD-10-CM | POA: Insufficient documentation

## 2018-04-26 DIAGNOSIS — Z7982 Long term (current) use of aspirin: Secondary | ICD-10-CM | POA: Insufficient documentation

## 2018-04-26 DIAGNOSIS — S70362A Insect bite (nonvenomous), left thigh, initial encounter: Secondary | ICD-10-CM | POA: Diagnosis not present

## 2018-04-26 DIAGNOSIS — W57XXXA Bitten or stung by nonvenomous insect and other nonvenomous arthropods, initial encounter: Secondary | ICD-10-CM | POA: Insufficient documentation

## 2018-04-26 DIAGNOSIS — Y9289 Other specified places as the place of occurrence of the external cause: Secondary | ICD-10-CM | POA: Diagnosis not present

## 2018-04-26 DIAGNOSIS — Y999 Unspecified external cause status: Secondary | ICD-10-CM | POA: Diagnosis not present

## 2018-04-26 DIAGNOSIS — Z853 Personal history of malignant neoplasm of breast: Secondary | ICD-10-CM | POA: Diagnosis not present

## 2018-04-26 DIAGNOSIS — T7840XA Allergy, unspecified, initial encounter: Secondary | ICD-10-CM

## 2018-04-26 DIAGNOSIS — Z79899 Other long term (current) drug therapy: Secondary | ICD-10-CM | POA: Insufficient documentation

## 2018-04-26 LAB — CBC
HEMATOCRIT: 41.9 % (ref 36.0–46.0)
Hemoglobin: 13.6 g/dL (ref 12.0–15.0)
MCH: 30.1 pg (ref 26.0–34.0)
MCHC: 32.5 g/dL (ref 30.0–36.0)
MCV: 92.7 fL (ref 78.0–100.0)
PLATELETS: 264 10*3/uL (ref 150–400)
RBC: 4.52 MIL/uL (ref 3.87–5.11)
RDW: 12.7 % (ref 11.5–15.5)
WBC: 7.6 10*3/uL (ref 4.0–10.5)

## 2018-04-26 LAB — BASIC METABOLIC PANEL
Anion gap: 7 (ref 5–15)
BUN: 17 mg/dL (ref 8–23)
CO2: 28 mmol/L (ref 22–32)
Calcium: 8.7 mg/dL — ABNORMAL LOW (ref 8.9–10.3)
Chloride: 105 mmol/L (ref 98–111)
Creatinine, Ser: 0.77 mg/dL (ref 0.44–1.00)
GFR calc Af Amer: >60 mL/min (ref >60)
GLUCOSE: 94 mg/dL (ref 70–99)
Potassium: 3.9 mmol/L (ref 3.5–5.1)
Sodium: 140 mmol/L (ref 135–145)

## 2018-04-26 MED ORDER — SODIUM CHLORIDE 0.9 % IV SOLN
INTRAVENOUS | Status: DC
  Administered 2018-04-26: 11:00:00 via INTRAVENOUS

## 2018-04-26 MED ORDER — PREDNISONE 10 MG (21) PO TBPK: ORAL_TABLET | ORAL | 0 refills | Status: DC

## 2018-04-26 MED ORDER — METHYLPREDNISOLONE SODIUM SUCC 125 MG IJ SOLR
125.0000 mg | Freq: Once | INTRAMUSCULAR | Status: AC
  Administered 2018-04-26: 125 mg via INTRAVENOUS
  Filled 2018-04-26: qty 2

## 2018-04-26 MED ORDER — FAMOTIDINE 20 MG PO TABS: 20.0000 mg | ORAL_TABLET | Freq: Every day | ORAL | 0 refills | Status: DC

## 2018-04-26 MED ORDER — DOXYCYCLINE HYCLATE 100 MG PO TABS
100.0000 mg | ORAL_TABLET | Freq: Once | ORAL | Status: AC
  Administered 2018-04-26: 100 mg via ORAL
  Filled 2018-04-26: qty 1

## 2018-04-26 MED ORDER — FAMOTIDINE IN NACL 20-0.9 MG/50ML-% IV SOLN
20.0000 mg | Freq: Once | INTRAVENOUS | Status: AC
  Administered 2018-04-26: 20 mg via INTRAVENOUS
  Filled 2018-04-26: qty 50

## 2018-04-26 MED ORDER — SULFAMETHOXAZOLE-TRIMETHOPRIM 800-160 MG PO TABS: 1.0000 | ORAL_TABLET | Freq: Once | ORAL | Status: DC

## 2018-04-26 MED ORDER — LORATADINE 10 MG PO TABS: 10.0000 mg | ORAL_TABLET | Freq: Every day | ORAL | 0 refills | Status: DC

## 2018-04-26 MED ORDER — DIPHENHYDRAMINE HCL 50 MG/ML IJ SOLN
25.0000 mg | Freq: Once | INTRAMUSCULAR | Status: AC
  Administered 2018-04-26: 25 mg via INTRAVENOUS
  Filled 2018-04-26: qty 1

## 2018-04-26 MED ORDER — DOXYCYCLINE HYCLATE 100 MG PO CAPS: 100.0000 mg | ORAL_CAPSULE | Freq: Two times a day (BID) | ORAL | 0 refills | Status: DC

## 2018-04-26 NOTE — ED Provider Notes (Signed)
[facility] DEPT Provider Note   CSN: [id number] Arrival date & time: 04/26/18  0917     History   Chief Complaint Chief Complaint  Patient presents with  . Insect Bite    HPI Brandi [name] is a 82 y.o. female.  Pt presents to the ED today with an insect bite.  The pt was gardening on Friday (6/28) and thought she may have gotten bitten by something.  She said it was small on Friday.  She poured hydrogen peroxide onto it.  Yesterday, it got a little bigger.  Today, she woke up and it was red and swollen.  No f/c.     Past Medical History:  Diagnosis Date  . Arthritis   . Breast cancer ([place]) 07/04/1999   s/p L lumpectomy and radiation/chemo  . Encounter for medication monitoring 09/15/2013  . Osteopenia 2011   T score -2.3  FRAX 4%/0.5%  . Personal history of chemotherapy 2000   Left Breast Cancer  . Personal history of radiation therapy 2000   Left Breast  . Seizure disorder ([place])    followed by Dr. Erling Cruz    Patient Active Problem List   Diagnosis Date Noted  . Medication monitoring encounter 09/11/2015  . Partial symptomatic epilepsy with complex partial seizures, not intractable, without status epilepticus ([place]) 09/11/2015  . Other forms of epilepsy and recurrent seizures without mention of intractable epilepsy 09/15/2013  . Encounter for medication monitoring 09/15/2013  . Medicare annual wellness visit, subsequent 06/25/2013  . Seizure disorder ([place])   . Breast cancer [facility])     Past Surgical History:  Procedure Laterality Date  . BREAST BIOPSY Right    Benign  . BREAST BIOPSY Left 07/04/1999   Malignant  . BREAST LUMPECTOMY Left 07/25/1999   Left Breast Cancer     OB History    Gravida  1   Para      Term      Preterm      AB  1   Living  0     SAB  1   TAB      Ectopic      Multiple      Live Births               Home Medications    Prior to Admission medications   Medication Sig Start Date  End Date Taking? Authorizing Provider  aspirin 81 MG tablet Take 81 mg by mouth daily.   Yes [provider]  fish oil-omega-3 fatty acids 1000 MG capsule Take 1 g by mouth daily.   Yes [provider]  glucosamine-chondroitin 500-400 MG tablet Take 1 tablet by mouth daily.   Yes [provider]  Multiple Vitamin (MULTIVITAMIN) capsule Take 1 capsule by mouth daily.     Yes [provider]  nystatin cream (MYCOSTATIN) Apply 1 application topically 2 (two) times daily as needed for dry skin.   Yes [provider]  phenytoin (DILANTIN) 100 MG ER capsule Take 1 capsule (100 mg total) by mouth 3 (three) times daily. Take 1 capsule by mouth three times daily on MWF. All other days, take BID. 09/16/17  Yes Dohmeier, Asencion Partridge, MD  doxycycline (VIBRAMYCIN) 100 MG capsule Take 1 capsule (100 mg total) by mouth 2 (two) times daily. 04/26/18   Isla Pence, MD  famotidine (PEPCID) 20 MG tablet Take 1 tablet (20 mg total) by mouth daily. 04/26/18   Isla Pence, MD  loratadine (CLARITIN) 10 MG  tablet Take 1 tablet (10 mg total) by mouth daily. 04/26/18   Isla Pence, MD  predniSONE (STERAPRED UNI-PAK 21 TAB) 10 MG (21) TBPK tablet Take 6 tabs by mouth daily  for 2 days, then 5 tabs for 2 days, then 4 tabs for 2 days, then 3 tabs for 2 days, 2 tabs for 2 days, then 1 tab by mouth daily for 2 days 04/26/18   Isla Pence, MD    Family History Family History  Problem Relation Age of Onset  . Cancer Brother        lung, smoker  . Stroke Brother   . Coronary artery disease Neg Hx     Social History Social History   Tobacco Use  . Smoking status: Never Smoker  . Smokeless tobacco: Never Used  Substance Use Topics  . Alcohol use: No  . Drug use: No     Allergies   Tegretol [carbamazepine] and Ibuprofen   Review of Systems Review of Systems  Skin: Positive for rash.  All other systems reviewed and are negative.    Physical Exam Updated  Vital Signs BP (!) 147/86 (BP Location: Right Arm)   Pulse 96   Temp 98 F (36.7 C) (Oral)   Resp 18   SpO2 96%   Physical Exam  Constitutional: She is oriented to person, place, and time. She appears well-developed and well-nourished.  HENT:  Head: Normocephalic and atraumatic.  Right Ear: External ear normal.  Left Ear: External ear normal.  Nose: Nose normal.  Mouth/Throat: Oropharynx is clear and moist.  Eyes: Pupils are equal, round, and reactive to light. Conjunctivae and EOM are normal.  Neck: Normal range of motion. Neck supple.  Cardiovascular: Normal rate, regular rhythm, normal heart sounds and intact distal pulses.  Pulmonary/Chest: Effort normal and breath sounds normal.  Abdominal: Soft. Bowel sounds are normal.  Musculoskeletal: Normal range of motion.  Neurological: She is oriented to person, place, and time.  Skin: Skin is warm. Capillary refill takes less than 2 seconds.  Fist size red and swollen area to left upper thigh.  No abscess felt.  Psychiatric: She has a normal mood and affect. Her behavior is normal. Judgment and thought content normal.  Nursing note and vitals reviewed.    ED Treatments / Results  Labs (all labs ordered are listed, but only abnormal results are displayed) Labs Reviewed  BASIC METABOLIC PANEL - Abnormal; Notable for the following components:      Result Value   Calcium 8.7 (*)    All other components within normal limits  CBC  ROCKY MTN SPOTTED FVR ABS PNL(IGG+IGM)    EKG None  Radiology No results found.  Procedures Procedures (including critical care time)  Medications Ordered in ED Medications  0.9 %  sodium chloride infusion ( Intravenous New Bag/Given 04/26/18 1108)  diphenhydrAMINE (BENADRYL) injection 25 mg (25 mg Intravenous Given 04/26/18 1108)  famotidine (PEPCID) IVPB 20 mg premix (0 mg Intravenous Stopped 04/26/18 1151)  methylPREDNISolone sodium succinate (SOLU-MEDROL) 125 mg/2 mL injection 125 mg (125 mg  Intravenous Given 04/26/18 1108)  doxycycline (VIBRA-TABS) tablet 100 mg (100 mg Oral Given 04/26/18 1108)     Initial Impression / Assessment and Plan / ED Course  I have reviewed the triage vital signs and the nursing notes.  Pertinent labs & imaging results that were available during my care of the patient were reviewed by me and considered in my medical decision making (see chart for details).    Redness has  decreased.  Pt will be treated with doxy for cellulitis and claritin/pepcid/prednisone for allergic rxn.  Return if worse.  F/u with pcp.  Final Clinical Impressions(s) / ED Diagnoses   Final diagnoses:  Allergic reaction, initial encounter  Cellulitis of left lower extremity  Insect bite of left thigh, initial encounter    ED Discharge Orders        Ordered    doxycycline (VIBRAMYCIN) 100 MG capsule  2 times daily     04/26/18 1152    loratadine (CLARITIN) 10 MG tablet  Daily     04/26/18 1152    predniSONE (STERAPRED UNI-PAK 21 TAB) 10 MG (21) TBPK tablet     04/26/18 1152    famotidine (PEPCID) 20 MG tablet  Daily     04/26/18 1152       Isla Pence, MD 04/26/18 1153

## 2018-04-26 NOTE — ED Triage Notes (Signed)
Patient here from home with complaints of left leg redness and swelling after being out in garden. "I think I got bit by something.

## 2018-04-29 LAB — ROCKY MTN SPOTTED FVR ABS PNL(IGG+IGM)
RMSF IGG: NEGATIVE
RMSF IgM: 0.69 index (ref 0.00–0.89)

## 2018-06-15 DIAGNOSIS — J309 Allergic rhinitis, unspecified: Secondary | ICD-10-CM | POA: Diagnosis not present

## 2018-06-15 DIAGNOSIS — N951 Menopausal and female climacteric states: Secondary | ICD-10-CM | POA: Diagnosis not present

## 2018-06-15 DIAGNOSIS — Z0000 Encounter for general adult medical examination without abnormal findings: Secondary | ICD-10-CM | POA: Diagnosis not present

## 2018-07-01 ENCOUNTER — Other Ambulatory Visit: Payer: Self-pay | Admitting: Neurology

## 2018-07-01 ENCOUNTER — Telehealth: Payer: Self-pay | Admitting: Neurology

## 2018-07-01 MED ORDER — PHENYTOIN SODIUM EXTENDED 100 MG PO CAPS: 100.0000 mg | ORAL_CAPSULE | Freq: Three times a day (TID) | ORAL | 5 refills | Status: DC

## 2018-07-01 NOTE — Telephone Encounter (Signed)
Time to Renew patient assistance . Please sign paper work and print.  hard copy RX.  Telephone [phone]- fax - [phone]   phenytoin (DILANTIN) 100 MG ER capsule

## 2018-07-01 NOTE — Addendum Note (Signed)
Addended by: Larey Seat on: 07/01/2018 04:23 PM   Modules accepted: Orders

## 2018-07-09 ENCOUNTER — Telehealth: Payer: Self-pay | Admitting: Neurology

## 2018-07-09 NOTE — Telephone Encounter (Signed)
Called patient and left her a message stating that her Dilantin had arrived. Dilantin 100 mg - 100 capsules in bottle  They sent one bottle .    Please call me when you are on your last bottle of medication to allow me to process refill delivery takes 7-10 business days.    Thanks [facility]  Patient assistance program  [phone]

## 2018-07-09 NOTE — Telephone Encounter (Signed)
Received a letter stating the patient was re enrolled into the [place] patient assistance program until 10/27/2018.

## 2018-07-23 DIAGNOSIS — M8588 Other specified disorders of bone density and structure, other site: Secondary | ICD-10-CM | POA: Diagnosis not present

## 2018-07-23 DIAGNOSIS — Z780 Asymptomatic menopausal state: Secondary | ICD-10-CM | POA: Diagnosis not present

## 2018-08-04 ENCOUNTER — Other Ambulatory Visit: Payer: Self-pay | Admitting: Neurology

## 2018-08-04 ENCOUNTER — Telehealth: Payer: Self-pay | Admitting: Neurology

## 2018-08-04 MED ORDER — PHENYTOIN SODIUM EXTENDED 100 MG PO CAPS: 100.0000 mg | ORAL_CAPSULE | Freq: Three times a day (TID) | ORAL | 3 refills | Status: DC

## 2018-08-04 NOTE — Telephone Encounter (Signed)
Only one bottle of Dilantin came for patient 30 day supply because Otho Darner  was written for 86 . Myriam Jacobson will you please print a new RX for Dilantin  100 mg ER  [phone] refills . Thanks Hinton Dyer .  Please fax new RX  Att Tom . [place] helped with the call. [employer] Patient assistance.

## 2018-08-04 NOTE — Telephone Encounter (Signed)
Order placed and will get Dr Brett Fairy to sign and give to [facility]

## 2018-08-05 NOTE — Telephone Encounter (Signed)
RX faxed to to [employer] Patient assistance att. Tom [phone] . New RX For Dilantin.

## 2018-08-13 NOTE — Telephone Encounter (Signed)
Dilantin was delivered three bottles . Patient came to pick up . 08/13/2018

## 2018-09-17 ENCOUNTER — Encounter: Payer: Self-pay | Admitting: Neurology

## 2018-09-17 ENCOUNTER — Ambulatory Visit (INDEPENDENT_AMBULATORY_CARE_PROVIDER_SITE_OTHER): Payer: Medicare HMO | Admitting: Neurology

## 2018-09-17 VITALS — BP 134/85 | HR 96 | Ht 62.0 in | Wt 125.0 lb

## 2018-09-17 DIAGNOSIS — Z79899 Other long term (current) drug therapy: Secondary | ICD-10-CM

## 2018-09-17 DIAGNOSIS — Z5181 Encounter for therapeutic drug level monitoring: Secondary | ICD-10-CM | POA: Diagnosis not present

## 2018-09-17 DIAGNOSIS — R4189 Other symptoms and signs involving cognitive functions and awareness: Secondary | ICD-10-CM

## 2018-09-17 MED ORDER — PHENYTOIN SODIUM EXTENDED 100 MG PO CAPS: 100.0000 mg | ORAL_CAPSULE | Freq: Three times a day (TID) | ORAL | 3 refills | Status: DC

## 2018-09-17 NOTE — Patient Instructions (Signed)
Phenytoin capsules and extended-release capsules What is this medicine? PHENYTOIN (FEN i toyn) is used to control seizures in certain types of epilepsy. It is also used to prevent seizures during or after surgery. This medicine may be used for other purposes; ask your health care provider or pharmacist if you have questions. COMMON BRAND NAME(S): Dilantin, Phenytek What should I tell my health care provider before I take this medicine? They need to know if you have any of these conditions: -an alcohol abuse problem -Asian ancestry -blood disorders or disease -diabetes -heart problems -kidney disease -liver disease -porphyria -receiving radiation therapy -suicidal thoughts, plans, or attempt; a previous suicide attempt by you or a family member -thyroid disease -an unusual or allergic reaction to phenytoin, other medicines, foods, dyes, or preservatives -pregnant or trying to get pregnant -breast-feeding How should I use this medicine? Take this medicine by mouth with a glass of water. Follow the directions on the prescription label. If you are taking extended-release capsules, swallow them whole. Do not crush or chew. Take this medicine with food if it upsets your stomach. It may be best to take your medicine consistently with or without food. Take your doses at regular intervals. Do not take your medicine more often than directed. Do not stop taking this medicine suddenly. This increases the risk of seizures. Your doctor will tell you how much medicine to take. If your doctor wants you to stop the medicine, the dose will be slowly lowered over time to avoid any side effects. Talk to your pediatrician regarding the use of this medicine in children. Special care may be needed. Overdosage: If you think you have taken too much of this medicine contact a poison control center or emergency room at once. NOTE: This medicine is only for you. Do not share this medicine with others. What if I miss a  dose? If you miss a dose, take it as soon as you can. If it is almost time for your next dose, take only that dose. Do not take double or extra doses. What may interact with this medicine? Do not take this medicine with any of the following medications: -delavirdine -ibrutinib -ranolazine This medicine may also interact with the following medications: -albendazole -alcohol -antiviral medicines for HIV or AIDS -aspirin and aspirin-like medicines -certain medicines for blood pressure like nifedipine, nimodipine, and verapamil -certain medicines for cancer -certain medicines for cholesterol like atorvastatin, simvastatin, and fluvastatin -certain medicines for depression, anxiety, or psychotic disturbances -certain medicines for fungal infections like ketoconazole and itraconazole -certain medicines for irregular heart beat like amiodarone and quinidine -certain medicines for seizures like carbamazepine, phenobarbital, and topiramate -certain medicines for stomach problems like cimetidine and omeprazole -chloramphenicol -cyclosporine -diazoxide -digoxin -disulfiram -doxycycline -female hormones, like estrogens and birth control pills -furosemide -halothane -isoniazid -medicines that relax muscles for surgery -methylphenidate -narcotic medicines for pain -phenothiazines like chlorpromazine, mesoridazine, prochlorperazine, thioridazine -praziquantel -reserpine -rifampin -St. John's Wort -steroid medicines like prednisone or cortisone -sulfonamides like sulfamethoxazole or sulfasalazine -supplements like folic acid or vitamin D -theophylline -ticlopidine -tolbutamide -warfarin This list may not describe all possible interactions. Give your health care provider a list of all the medicines, herbs, non-prescription drugs, or dietary supplements you use. Also tell them if you smoke, drink alcohol, or use illegal drugs. Some items may interact with your medicine. What should I  watch for while using this medicine? Visit your doctor or health care professional for regular checks on your progress. This medicine needs careful monitoring. Your doctor or  health care professional may schedule regular blood tests. Wear a medical ID bracelet or chain, and carry a card that describes your disease and details of your medicine and dosage times. Do not change brands or dosage forms of this medicine without discussing the change with your doctor or health care professional. Dennis Bast may get drowsy or dizzy. Do not drive, use machinery, or do anything that needs mental alertness until you know how this medicine affects you. Do not stand or sit up quickly, especially if you are an older patient. This reduces the risk of dizzy or fainting spells. Alcohol may interfere with the effect of this medicine. Avoid alcoholic drinks. Birth control pills may not work properly while you are taking this medicine. Talk to your doctor about using an extra method of birth control. This medicine can cause unusual growth of gum tissues. Visit your dentist regularly. Problems can arise if you need dental work, and in the day to day care of your teeth. Try to avoid damage to your teeth and gums when you brush or floss your teeth. Do not take antacids at the same time as this medicine. If you get an upset stomach and want to take an antacid or medicine for diarrhea, make sure there is an interval of 2 to 3 hours before or after you took your phenytoin. The use of this medicine may increase the chance of suicidal thoughts or actions. Pay special attention to how you are responding while on this medicine. Any worsening of mood, or thoughts of suicide or dying should be reported to your health care professional right away. Women who become pregnant while using this medicine may enroll in the [place] Pregnancy Registry by calling [phone]. This registry collects information about the safety of  antiepileptic drug use during pregnancy. What side effects may I notice from receiving this medicine? Side effects that you should report to your doctor or health care professional as soon as possible: -allergic reactions like skin rash, itching or hives, swelling of the face, lips, or tongue -breathing problems -changes in vision -chest pain or tightness -confusion -dark yellow or brown urine -fast or irregular heartbeat -fever, sore throat -headache -loss of seizure control -poor control of body movements or difficulty walking -redness, blistering, peeling or loosening of the skin, including inside the mouth -unusual bleeding or bruising, pinpoint red spots on skin -vomiting -worsening of mood, thoughts or actions of suicide or dying -yellowing of the eyes or skin Side effects that usually do not require medical attention (report to your doctor or health care professional if they continue or are bothersome): -constipation -difficulty sleeping -excessive hair growth on the face or body -nausea This list may not describe all possible side effects. Call your doctor for medical advice about side effects. You may report side effects to FDA at 1-800-FDA-1088. Where should I keep my medicine? Keep out of the reach of children. Store at room temperature between 15 and 30 degrees C (59 and 86 degrees F). Protect from light and moisture. Throw away any unused medicine after the expiration date. NOTE: This sheet is a summary. It may not cover all possible information. If you have questions about this medicine, talk to your doctor, pharmacist, or health care provider.  2018 Elsevier/Gold Standard (2013-05-10 15:07:05)
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
## 2018-09-18 LAB — PHENYTOIN LEVEL, TOTAL: Phenytoin (Dilantin), Serum: 23.2 ug/mL (ref 10.0–20.0)

## 2018-10-05 ENCOUNTER — Telehealth: Payer: Self-pay | Admitting: Neurology

## 2018-10-05 NOTE — Telephone Encounter (Signed)
Patient calling to discuss lowering dosage of phenytoin (DILANTIN) 100 MG ER capsule. Can she take Estrogen with Dilantin for night sweats?

## 2018-10-06 NOTE — Telephone Encounter (Signed)
Called the patient back and addressed the dosage if Dilantin needing to remain the same. She doesn't want to change the dosage. Advised the patient the vitamin otc is ok to take with dilanitin. Pt verbalized understanding. Re educated on how to take her seizure medication. Pt verbalized understanding.

## 2018-10-08 ENCOUNTER — Other Ambulatory Visit: Payer: Self-pay | Admitting: Family Medicine

## 2018-10-08 DIAGNOSIS — Z1231 Encounter for screening mammogram for malignant neoplasm of breast: Secondary | ICD-10-CM

## 2018-10-23 ENCOUNTER — Other Ambulatory Visit: Payer: Self-pay | Admitting: Family Medicine

## 2018-10-23 DIAGNOSIS — N6320 Unspecified lump in the left breast, unspecified quadrant: Secondary | ICD-10-CM

## 2018-11-02 ENCOUNTER — Ambulatory Visit
Admission: RE | Admit: 2018-11-02 | Discharge: 2018-11-02 | Disposition: A | Discharge status: Home / Self Care | Payer: Medicare HMO | Source: Ambulatory Visit | Attending: Family Medicine | Admitting: Family Medicine

## 2018-11-02 ENCOUNTER — Ambulatory Visit
Admission: RE | Admit: 2018-11-02 | Discharge: 2018-11-02 | Disposition: A | Discharge status: Home / Self Care | Payer: BC Managed Care – PPO | Source: Ambulatory Visit | Attending: Family Medicine | Admitting: Family Medicine

## 2018-11-02 DIAGNOSIS — N6320 Unspecified lump in the left breast, unspecified quadrant: Secondary | ICD-10-CM

## 2018-11-09 ENCOUNTER — Telehealth: Payer: Self-pay | Admitting: Neurology

## 2018-11-09 NOTE — Telephone Encounter (Signed)
Patient's Dilantin came # ID [id number] 3 bottles came Dilantin 100 mg  -- 100 capsules in each bottle.   Patient is aware medication at the front desk for pick up.

## 2018-11-16 ENCOUNTER — Ambulatory Visit: Payer: BC Managed Care – PPO

## 2019-01-12 ENCOUNTER — Ambulatory Visit: Payer: BC Managed Care – PPO

## 2019-05-18 NOTE — Telephone Encounter (Signed)
PAP Dilantin will be here 7-10 business days [employer] order number [phone]

## 2019-05-31 ENCOUNTER — Telehealth: Payer: Self-pay | Admitting: Neurology

## 2019-05-31 NOTE — Telephone Encounter (Signed)
Patient's refill came from [facility] in her Dilantin. Three bottle's 100 mg - 100 capsules sin each bottle.  [place] [phone]  end of  approved 10/28/2019 Dilantin Id # [id number] -    Letter Placed in Patients Bag.  Please call me when you are on your last bottle medication to allow me process refill delivery it takes 7-10 business days.  Thanks, Rance Muir [facility]  Patient assistance program  [phone]

## 2019-06-02 ENCOUNTER — Ambulatory Visit
Admission: RE | Admit: 2019-06-02 | Discharge: 2019-06-02 | Disposition: A | Discharge status: Home / Self Care | Payer: BC Managed Care – PPO | Source: Ambulatory Visit | Attending: Family Medicine | Admitting: Family Medicine

## 2019-06-02 ENCOUNTER — Other Ambulatory Visit: Payer: Self-pay | Admitting: Family Medicine

## 2019-06-02 ENCOUNTER — Other Ambulatory Visit: Payer: Self-pay

## 2019-06-02 ENCOUNTER — Ambulatory Visit: Admission: RE | Admit: 2019-06-02 | Payer: BC Managed Care – PPO | Source: Ambulatory Visit

## 2019-06-02 DIAGNOSIS — Z1231 Encounter for screening mammogram for malignant neoplasm of breast: Secondary | ICD-10-CM

## 2019-06-02 DIAGNOSIS — N63 Unspecified lump in unspecified breast: Secondary | ICD-10-CM

## 2019-07-01 ENCOUNTER — Ambulatory Visit
Admission: RE | Admit: 2019-07-01 | Discharge: 2019-07-01 | Disposition: A | Discharge status: Home / Self Care | Payer: BC Managed Care – PPO | Source: Ambulatory Visit | Attending: Family Medicine | Admitting: Family Medicine

## 2019-07-01 ENCOUNTER — Other Ambulatory Visit: Payer: Self-pay | Admitting: Family Medicine

## 2019-07-01 DIAGNOSIS — M25562 Pain in left knee: Secondary | ICD-10-CM

## 2019-09-21 ENCOUNTER — Ambulatory Visit (INDEPENDENT_AMBULATORY_CARE_PROVIDER_SITE_OTHER): Payer: Medicare Other | Admitting: Adult Health

## 2019-09-21 DIAGNOSIS — R569 Unspecified convulsions: Secondary | ICD-10-CM | POA: Diagnosis not present

## 2019-09-21 NOTE — Progress Notes (Signed)
  [facility] [address] [place]. [place] [address] [phone]     Virtual Visit via Telephone Note  I connected with Brandi [name] on 09/21/19 at  2:30 PM EST by telephone located remotely at [facility] and verified that I am speaking with the correct person using two identifiers who reports being located at home.   Visit scheduled by RN. She discussed the limitations, risks, security and privacy concerns of performing an evaluation and management service by telephone and the availability of in person appointments. I also discussed with the patient that there may be a patient responsible charge related to this service. The patient expressed understanding and agreed to proceed. See telephone note for consent and additional scheduling information.    History of Present Illness:  Brandi [name] is a 83 y.o. female who has been followed in this office for seizures.  She was originally scheduled for face-to-face visit however due to COVID-19 she requested to have a telephone visit.  She is unable to do a video visit.  She reports that she is not had any seizure events.  She continues on Dilantin 100 mg 3 times a day.  She reports that she is able to complete all ADLs independently.  She denies any changes with her gait or balance.  Overall she feels that she is doing well   Observations/Objective:    Neurological examination  Mentation: Alert oriented to time, place, history taking.  speech and language fluent  Assessment and Plan:  1: Seizures  -Continue Dilantin 100 mg 3 times a day -Patient requested to wait to have blood work-she does not want to come in the office due to [place] that she has any seizure event she should let us know  Follow Up Instructions:    FU 1 year   I discussed the assessment and treatment plan with the patient.  The patient was provided an opportunity to ask questions and all were answered to their  satisfaction. The patient agreed with the plan and verbalized an understanding of the instructions.   I provided 10 minutes of non-face-to-face time during this encounter.   Ward Givens NP-C  [facility] [address] [place],  [phone]

## 2019-10-15 ENCOUNTER — Telehealth: Payer: Self-pay | Admitting: Diagnostic Neuroimaging

## 2019-10-15 NOTE — Telephone Encounter (Signed)
Patient calling to pick up dilantin at the office. She will call on Monday to check again.  Penni Bombard, MD [id number], [id number] PM Certified in Neurology, Neurophysiology and Neuroimaging  [facility] [address] [place], [place] [address] [phone]

## 2019-10-18 ENCOUNTER — Other Ambulatory Visit: Payer: Self-pay | Admitting: Neurology

## 2019-10-18 ENCOUNTER — Other Ambulatory Visit: Payer: Self-pay | Admitting: Adult Health

## 2019-10-18 MED ORDER — PHENYTOIN SODIUM EXTENDED 100 MG PO CAPS: 100.0000 mg | ORAL_CAPSULE | Freq: Three times a day (TID) | ORAL | 3 refills | Status: DC

## 2019-10-18 MED ORDER — PHENYTOIN SODIUM EXTENDED 100 MG PO CAPS: ORAL_CAPSULE | ORAL | 3 refills | Status: DC

## 2019-10-18 NOTE — Telephone Encounter (Signed)
Script has been signed by Jinny Blossom NP and faxed to the number below. Received a receipt of confirmation of fax sent through.

## 2019-10-18 NOTE — Telephone Encounter (Signed)
Patient called back to inform she has 30 pills left. Please follow up.

## 2019-10-18 NOTE — Telephone Encounter (Signed)
Patient came into the office today and wanted to know where her medication was. I went out to the lobby to meet with her and she stated that she forgot to call in and tell Hinton Dyer she was on her last bottle. She is almost out of medication now and her patient assistance has not been requested yet. I asked her how many days of medication she has remaining but she did not know. I asked her to go home and find out how much medication is remaining and give [place] a call and let us know how much medication she has.    I called [employer] at the number below. The script is old and they need a new one to be faxed to [phone]. This will take a few days to process. Please send script to the number provided.    [place] [phone]  end of  approved 10/28/2019 Dilantin Id # [id number] -

## 2019-10-20 NOTE — Telephone Encounter (Signed)
I called the patient to make her aware and make sure she has enough to last her until the medication gets here. She said that she has enough to last until medication arrives.

## 2019-10-20 NOTE — Telephone Encounter (Signed)
I called [employer] to check status order has been placed. It will be here in 7-10 days. Order ID [id number]

## 2019-10-25 ENCOUNTER — Telehealth: Payer: Self-pay | Admitting: Neurology

## 2019-10-25 NOTE — Telephone Encounter (Signed)
Its time for patient assistance renewal will you please get Dr. Brett Fairy to sign and I will need a hard copy RX. Thanks Hinton Dyer

## 2019-10-27 NOTE — Telephone Encounter (Signed)
I spoke to patient and her sister x 2 relayed I needed her proof of income and to come sign paper work . Sister stated they would be her Tuesday they did not come. I have patient 's packet ready to go.

## 2019-11-02 ENCOUNTER — Telehealth: Payer: Self-pay | Admitting: *Deleted

## 2019-11-02 NOTE — Telephone Encounter (Signed)
Patient Brandi [name] is here. I called to make the patient aware but she did not answer so I left a VM. DWD

## 2019-11-02 NOTE — Telephone Encounter (Signed)
Patient picked up her Dilantin today and is under the impression that this is the last of the medication she will be able to get from the patient assistance.  If that is the case is there another medication she will be able to get?  Please call.

## 2019-11-09 NOTE — Telephone Encounter (Signed)
I have faxed everything thing for PAP . For Renewal for her Dilantin

## 2019-11-21 ENCOUNTER — Ambulatory Visit: Payer: BC Managed Care – PPO

## 2019-12-29 NOTE — Telephone Encounter (Signed)
Called and spoke to [employer] is not offered anymore. I will have to call patient and relay to her to call her insurance to see who cover's  . Patient is going to check with [facility] to see how much it is.  Patient still does have medication at this time. I relayed to patient that to please call us if we need to send a refill we can . Patient will call us back and she understands details. Thanks Hinton Dyer .

## 2019-12-29 NOTE — Telephone Encounter (Signed)
Patient called back and wants her script called in [place] . Patient wants to know if she can go to Generic Dilantin ?

## 2019-12-30 ENCOUNTER — Other Ambulatory Visit: Payer: Self-pay | Admitting: Neurology

## 2019-12-30 MED ORDER — PHENYTOIN SODIUM EXTENDED 100 MG PO CAPS: ORAL_CAPSULE | ORAL | 1 refills | Status: DC

## 2019-12-30 NOTE — Telephone Encounter (Signed)
Script has been sent for the generic dilantin to the pharmacy advised by patient.

## 2020-02-09 ENCOUNTER — Other Ambulatory Visit: Payer: Self-pay | Admitting: Family Medicine

## 2020-02-09 DIAGNOSIS — N6320 Unspecified lump in the left breast, unspecified quadrant: Secondary | ICD-10-CM

## 2020-02-22 ENCOUNTER — Ambulatory Visit: Payer: BC Managed Care – PPO

## 2020-02-22 ENCOUNTER — Ambulatory Visit
Admission: RE | Admit: 2020-02-22 | Discharge: 2020-02-22 | Disposition: A | Discharge status: Home / Self Care | Payer: Medicare Other | Source: Ambulatory Visit | Attending: Family Medicine | Admitting: Family Medicine

## 2020-02-22 ENCOUNTER — Other Ambulatory Visit: Payer: Self-pay

## 2020-02-22 DIAGNOSIS — N6320 Unspecified lump in the left breast, unspecified quadrant: Secondary | ICD-10-CM

## 2020-03-21 ENCOUNTER — Telehealth: Payer: Self-pay | Admitting: Adult Health

## 2020-03-21 NOTE — Telephone Encounter (Signed)
I called pt to discuss. No answer, VM full. Will try again later. 

## 2020-03-21 NOTE — Telephone Encounter (Signed)
I called pt again to discuss. No answer, VM is full.   Candace, please try to reach pt to discuss this tomorrow.

## 2020-03-21 NOTE — Telephone Encounter (Signed)
-----   Message from Ward Givens, NP sent at 09/21/2019  2:54 PM EST ----- Dilantin level

## 2020-03-21 NOTE — Telephone Encounter (Signed)
Patient needs to have blood work, CBC, CMP and Dilantin level. Can you see if the patient is willing to come in for this?

## 2020-03-22 NOTE — Telephone Encounter (Signed)
Spoke to pt, she is willing to come in next week for blood work.

## 2020-04-10 ENCOUNTER — Other Ambulatory Visit: Payer: Self-pay

## 2020-04-10 ENCOUNTER — Other Ambulatory Visit: Payer: Self-pay | Admitting: Adult Health

## 2020-04-10 ENCOUNTER — Other Ambulatory Visit (INDEPENDENT_AMBULATORY_CARE_PROVIDER_SITE_OTHER): Payer: Self-pay

## 2020-04-10 DIAGNOSIS — Z79899 Other long term (current) drug therapy: Secondary | ICD-10-CM

## 2020-04-10 DIAGNOSIS — Z0289 Encounter for other administrative examinations: Secondary | ICD-10-CM

## 2020-04-11 ENCOUNTER — Telehealth: Payer: Self-pay

## 2020-04-11 LAB — COMPREHENSIVE METABOLIC PANEL
ALT: 23 IU/L (ref 0–32)
AST: 21 IU/L (ref 0–40)
Albumin/Globulin Ratio: 1.2 (ref 1.2–2.2)
Albumin: 3.8 g/dL (ref 3.6–4.6)
Alkaline Phosphatase: 123 IU/L — ABNORMAL HIGH (ref 48–121)
BUN/Creatinine Ratio: 13 (ref 12–28)
BUN: 11 mg/dL (ref 8–27)
Bilirubin Total: <0.2 mg/dL (ref 0.0–1.2)
CO2: 23 mmol/L (ref 20–29)
Calcium: 9.5 mg/dL (ref 8.7–10.3)
Chloride: 104 mmol/L (ref 96–106)
Creatinine, Ser: 0.82 mg/dL (ref 0.57–1.00)
GFR calc Af Amer: 78 mL/min/{1.73_m2} (ref >59)
GFR calc non Af Amer: 67 mL/min/{1.73_m2} (ref >59)
Globulin, Total: 3.1 g/dL (ref 1.5–4.5)
Glucose: 89 mg/dL (ref 65–99)
Potassium: 4.5 mmol/L (ref 3.5–5.2)
Sodium: 141 mmol/L (ref 134–144)
Total Protein: 6.9 g/dL (ref 6.0–8.5)

## 2020-04-11 LAB — CBC WITH DIFFERENTIAL/PLATELET
Basophils Absolute: 0.1 10*3/uL (ref 0.0–0.2)
Basos: 1 %
EOS (ABSOLUTE): 0.2 10*3/uL (ref 0.0–0.4)
Eos: 3 %
Hematocrit: 37.5 % (ref 34.0–46.6)
Hemoglobin: 12.7 g/dL (ref 11.1–15.9)
Immature Grans (Abs): 0.1 10*3/uL (ref 0.0–0.1)
Immature Granulocytes: 1 %
Lymphocytes Absolute: 2.3 10*3/uL (ref 0.7–3.1)
Lymphs: 28 %
MCH: 29.8 pg (ref 26.6–33.0)
MCHC: 33.9 g/dL (ref 31.5–35.7)
MCV: 88 fL (ref 79–97)
Monocytes Absolute: 0.6 10*3/uL (ref 0.1–0.9)
Monocytes: 7 %
Neutrophils Absolute: 5 10*3/uL (ref 1.4–7.0)
Neutrophils: 60 %
Platelets: 304 10*3/uL (ref 150–450)
RBC: 4.26 x10E6/uL (ref 3.77–5.28)
RDW: 12.1 % (ref 11.7–15.4)
WBC: 8.2 10*3/uL (ref 3.4–10.8)

## 2020-04-11 LAB — PHENYTOIN LEVEL, TOTAL: Phenytoin (Dilantin), Serum: 9.7 ug/mL — ABNORMAL LOW (ref 10.0–20.0)

## 2020-04-11 NOTE — Telephone Encounter (Signed)
Attempted to contact the patient re lab results. Pt was not available. LM on the VM.  I was able to contact the pt's POA Hassan Rowan and notified her of the message of unremarkable labs.

## 2020-04-11 NOTE — Telephone Encounter (Signed)
-----   Message from Ward Givens, NP sent at 04/11/2020  2:09 PM EDT ----- Labs results are unremarkable. Please call patient with results.

## 2020-04-21 ENCOUNTER — Other Ambulatory Visit: Payer: Self-pay | Admitting: Family Medicine

## 2020-04-21 DIAGNOSIS — Z1231 Encounter for screening mammogram for malignant neoplasm of breast: Secondary | ICD-10-CM

## 2020-05-24 ENCOUNTER — Other Ambulatory Visit: Payer: Self-pay | Admitting: Neurology

## 2020-06-02 ENCOUNTER — Ambulatory Visit
Admission: RE | Admit: 2020-06-02 | Discharge: 2020-06-02 | Disposition: A | Discharge status: Home / Self Care | Payer: Medicare Other | Source: Ambulatory Visit | Attending: Family Medicine | Admitting: Family Medicine

## 2020-06-02 ENCOUNTER — Other Ambulatory Visit: Payer: Self-pay

## 2020-06-02 DIAGNOSIS — Z1231 Encounter for screening mammogram for malignant neoplasm of breast: Secondary | ICD-10-CM

## 2020-06-28 DIAGNOSIS — N63 Unspecified lump in unspecified breast: Secondary | ICD-10-CM | POA: Diagnosis not present

## 2020-06-28 DIAGNOSIS — Z0000 Encounter for general adult medical examination without abnormal findings: Secondary | ICD-10-CM | POA: Diagnosis not present

## 2020-07-24 ENCOUNTER — Other Ambulatory Visit: Payer: Self-pay | Admitting: Neurology

## 2020-07-24 MED ORDER — PHENYTOIN SODIUM EXTENDED 100 MG PO CAPS: ORAL_CAPSULE | ORAL | 0 refills | Status: DC

## 2020-08-26 ENCOUNTER — Ambulatory Visit: Payer: Medicare Other

## 2020-09-12 DIAGNOSIS — N63 Unspecified lump in unspecified breast: Secondary | ICD-10-CM | POA: Diagnosis not present

## 2020-09-15 ENCOUNTER — Other Ambulatory Visit: Payer: Self-pay | Admitting: Adult Health

## 2020-09-20 ENCOUNTER — Ambulatory Visit (INDEPENDENT_AMBULATORY_CARE_PROVIDER_SITE_OTHER): Payer: Medicare HMO | Admitting: Adult Health

## 2020-09-20 ENCOUNTER — Encounter: Payer: Self-pay | Admitting: Adult Health

## 2020-09-20 ENCOUNTER — Other Ambulatory Visit: Payer: Self-pay

## 2020-09-20 VITALS — BP 120/74 | HR 70

## 2020-09-20 DIAGNOSIS — Z79899 Other long term (current) drug therapy: Secondary | ICD-10-CM

## 2020-09-20 DIAGNOSIS — R569 Unspecified convulsions: Secondary | ICD-10-CM | POA: Diagnosis not present

## 2020-09-20 MED ORDER — PHENYTOIN SODIUM EXTENDED 100 MG PO CAPS: ORAL_CAPSULE | ORAL | 3 refills | Status: DC

## 2020-09-20 NOTE — Progress Notes (Signed)
PATIENT: Brandi [name] DOB: 1936-10-03  REASON FOR VISIT: follow up HISTORY FROM: patient  HISTORY OF PRESENT ILLNESS: Today 09/20/20:  Brandi [name] is an 84 year old female with a history of seizures. She returns today for follow-up. She remains on Dilantin 100 mg 3 times a day. She is able to complete all ADLs independently. She does not operate a motor vehicle. No changes with her gait or balance. Denies any falls. She returns today for evaluation.  HISTORY 09/21/19: Brandi [name] is a 84 y.o. female who has been followed in this office for seizures.  She was originally scheduled for face-to-face visit however due to COVID-19 she requested to have a telephone visit.  She is unable to do a video visit.  She reports that she is not had any seizure events.  She continues on Dilantin 100 mg 3 times a day.  She reports that she is able to complete all ADLs independently.  She denies any changes with her gait or balance.  Overall she feels that she is doing well   REVIEW OF SYSTEMS: Out of a complete 14 system review of symptoms, the patient complains only of the following symptoms, and all other reviewed systems are negative.  See HPI  ALLERGIES: Allergies  Allergen Reactions  . Tegretol [Carbamazepine] Itching  . Ibuprofen Other (See Comments)    Feels whoozy    HOME MEDICATIONS: Outpatient Medications Prior to Visit  Medication Sig Dispense Refill  . aspirin 81 MG tablet Take 81 mg by mouth daily.    . fish oil-omega-3 fatty acids 1000 MG capsule Take 1 g by mouth daily.    Marland Kitchen glucosamine-chondroitin 500-400 MG tablet Take 1 tablet by mouth daily.    . Multiple Vitamin (MULTIVITAMIN) capsule Take 1 capsule by mouth daily.      . phenytoin (DILANTIN) 100 MG ER capsule TAKE 1 CAPSULE THREE TIMES DAILY ON MONDAY, WEDNESDAY AND FRIDAY. ALL OTHER DAYS TAKE TWICE DAILY. 204 capsule 0   No facility-administered medications prior to visit.    PAST MEDICAL HISTORY: Past Medical  History:  Diagnosis Date  . Arthritis   . Breast cancer ([place]) 07/04/1999   s/p L lumpectomy and radiation/chemo  . Encounter for medication monitoring 09/15/2013  . Osteopenia 2011   T score -2.3  FRAX 4%/0.5%  . Personal history of chemotherapy 2000   Left Breast Cancer  . Personal history of radiation therapy 2000   Left Breast  . Seizure disorder ([place])    followed by Dr. Erling Cruz    PAST SURGICAL HISTORY: Past Surgical History:  Procedure Laterality Date  . BREAST BIOPSY Right    Benign  . BREAST BIOPSY Left 07/04/1999   Malignant  . BREAST LUMPECTOMY Left 07/25/1999   Left Breast Cancer    FAMILY HISTORY: Family History  Problem Relation Age of Onset  . Cancer Brother        lung, smoker  . Stroke Brother   . Coronary artery disease Neg Hx     SOCIAL HISTORY: Social History   Socioeconomic History  . Marital status: Single    Spouse name: Not on file  . Number of children: 1  . Years of education: 12+  . Highest education level: Not on file  Occupational History  . Occupation: Retired   Tobacco Use  . Smoking status: Never Smoker  . Smokeless tobacco: Never Used  Substance and Sexual Activity  . Alcohol use: No  . Drug use: No  . Sexual activity:  Not Currently  Other Topics Concern  . Not on file  Social History Narrative   Caffeine: minimal cup coffee   Lives alone, 1 dog   Occupation: retired, was CNA   Edu: HS   Activity: bicycles, walking some      ADVANCED DIRECTIVES: The patient's daughter Mayford Knife is healthcare power of attorney. She can be reached through her cell #   [phone], or at home, at [phone]).   Social Determinants of Health   Financial Resource Strain:   . Difficulty of Paying Living Expenses: Not on file  Food Insecurity:   . Worried About Charity fundraiser in the Last Year: Not on file  . Ran Out of Food in the Last Year: Not on file  Transportation Needs:   . Lack of Transportation (Medical): Not on file  .  Lack of Transportation (Non-Medical): Not on file  Physical Activity:   . Days of Exercise per Week: Not on file  . Minutes of Exercise per Session: Not on file  Stress:   . Feeling of Stress : Not on file  Social Connections:   . Frequency of Communication with Friends and Family: Not on file  . Frequency of Social Gatherings with Friends and Family: Not on file  . Attends Religious Services: Not on file  . Active Member of Clubs or Organizations: Not on file  . Attends Archivist Meetings: Not on file  . Marital Status: Not on file  Intimate Partner Violence:   . Fear of Current or Ex-Partner: Not on file  . Emotionally Abused: Not on file  . Physically Abused: Not on file  . Sexually Abused: Not on file      PHYSICAL EXAM  Vitals:   09/20/20 1359  BP: 120/74  Pulse: 70   There is no height or weight on file to calculate BMI.  Generalized: Well developed, in no acute distress   Neurological examination  Mentation: Alert oriented to time, place, history taking. Follows all commands speech and language fluent Cranial nerve II-XII: Pupils were equal round reactive to light. Extraocular movements were full, visual field were full on confrontational test.. Head turning and shoulder shrug  were normal and symmetric. Motor: The motor testing reveals 5 over 5 strength of all 4 extremities. Good symmetric motor tone is noted throughout.  Sensory: Sensory testing is intact to soft touch on all 4 extremities. No evidence of extinction is noted.  Coordination: Cerebellar testing reveals good finger-nose-finger and heel-to-shin bilaterally.  Gait and station: Gait is normal.  Reflexes: Deep tendon reflexes are symmetric and normal bilaterally.   DIAGNOSTIC DATA (LABS, IMAGING, TESTING) - I reviewed patient records, labs, notes, testing and imaging myself where available.  Lab Results  Component Value Date   WBC 8.2 04/10/2020   HGB 12.7 04/10/2020   HCT 37.5 04/10/2020    MCV 88 04/10/2020   PLT 304 04/10/2020      Component Value Date/Time   NA 141 04/10/2020 1114   NA 140 03/17/2014 1333   K 4.5 04/10/2020 1114   K 3.6 03/17/2014 1333   CL 104 04/10/2020 1114   CL 104 02/24/2013 1330   CO2 23 04/10/2020 1114   CO2 24 03/17/2014 1333   GLUCOSE 89 04/10/2020 1114   GLUCOSE 94 04/26/2018 1111   GLUCOSE 132 03/17/2014 1333   GLUCOSE 110 (H) 02/24/2013 1330   BUN 11 04/10/2020 1114   BUN 14.4 03/17/2014 1333   CREATININE 0.82 04/10/2020 1114  CREATININE 0.9 03/17/2014 1333   CALCIUM 9.5 04/10/2020 1114   CALCIUM 9.2 03/17/2014 1333   PROT 6.9 04/10/2020 1114   PROT 7.1 03/17/2014 1333   ALBUMIN 3.8 04/10/2020 1114   ALBUMIN 3.6 03/17/2014 1333   AST 21 04/10/2020 1114   AST 16 03/17/2014 1333   ALT 23 04/10/2020 1114   ALT 15 03/17/2014 1333   ALKPHOS 123 (H) 04/10/2020 1114   ALKPHOS 86 03/17/2014 1333   BILITOT <0.2 04/10/2020 1114   BILITOT 0.24 03/17/2014 1333   GFRNONAA 67 04/10/2020 1114   GFRAA 78 04/10/2020 1114   Lab Results  Component Value Date   CHOL 181 06/18/2013   HDL 47.70 06/18/2013   LDLCALC 105 (H) 06/18/2013   TRIG 144.0 06/18/2013   CHOLHDL 4 06/18/2013      ASSESSMENT AND PLAN 84 y.o. year old female  has a past medical history of Arthritis, Breast cancer ([place]) (07/04/1999), Encounter for medication monitoring (09/15/2013), Osteopenia (2011), Personal history of chemotherapy (2000), Personal history of radiation therapy (2000), and Seizure disorder ([place]). here with :  1. Seizures  --Continue Dilantin 100 mg 3 times a day on MWF and 100 mg BID other days --Blood work today --Advised if she has any seizure event she should let us know --Follow-up in 1 year or sooner if needed  I spent 25 minutes of face-to-face and non-face-to-face time with patient.  This included previsit chart review, lab review, study review, order entry, electronic health record documentation, patient education.  Ward Givens,  MSN, NP-C 09/20/2020, 1:58 PM [facility] [address] [place] [employer], [place] [address] [phone]

## 2020-09-20 NOTE — Patient Instructions (Addendum)
Continue Dilantin 100 mg 3 times a day on MWF and 100 mg twice a day all other days Blood work today If you have any seizure events please let us know.

## 2020-09-21 LAB — CBC WITH DIFFERENTIAL/PLATELET
Basophils Absolute: 0.1 10*3/uL (ref 0.0–0.2)
Basos: 1 %
EOS (ABSOLUTE): 0.2 10*3/uL (ref 0.0–0.4)
Eos: 2 %
Hematocrit: 36.2 % (ref 34.0–46.6)
Hemoglobin: 11.7 g/dL (ref 11.1–15.9)
Immature Grans (Abs): 0.1 10*3/uL (ref 0.0–0.1)
Immature Granulocytes: 1 %
Lymphocytes Absolute: 2.2 10*3/uL (ref 0.7–3.1)
Lymphs: 20 %
MCH: 28.7 pg (ref 26.6–33.0)
MCHC: 32.3 g/dL (ref 31.5–35.7)
MCV: 89 fL (ref 79–97)
Monocytes Absolute: 0.9 10*3/uL (ref 0.1–0.9)
Monocytes: 8 %
Neutrophils Absolute: 7.4 10*3/uL — ABNORMAL HIGH (ref 1.4–7.0)
Neutrophils: 68 %
Platelets: 362 10*3/uL (ref 150–450)
RBC: 4.07 x10E6/uL (ref 3.77–5.28)
RDW: 12 % (ref 11.7–15.4)
WBC: 10.8 10*3/uL (ref 3.4–10.8)

## 2020-09-21 LAB — COMPREHENSIVE METABOLIC PANEL
ALT: 16 IU/L (ref 0–32)
AST: 20 IU/L (ref 0–40)
Albumin/Globulin Ratio: 1.1 — ABNORMAL LOW (ref 1.2–2.2)
Albumin: 3.7 g/dL (ref 3.6–4.6)
Alkaline Phosphatase: 94 IU/L (ref 44–121)
BUN/Creatinine Ratio: 11 — ABNORMAL LOW (ref 12–28)
BUN: 9 mg/dL (ref 8–27)
Bilirubin Total: <0.2 mg/dL (ref 0.0–1.2)
CO2: 26 mmol/L (ref 20–29)
Calcium: 9.1 mg/dL (ref 8.7–10.3)
Chloride: 102 mmol/L (ref 96–106)
Creatinine, Ser: 0.83 mg/dL (ref 0.57–1.00)
GFR calc Af Amer: 76 mL/min/{1.73_m2} (ref >59)
GFR calc non Af Amer: 66 mL/min/{1.73_m2} (ref >59)
Globulin, Total: 3.3 g/dL (ref 1.5–4.5)
Glucose: 83 mg/dL (ref 65–99)
Potassium: 4.1 mmol/L (ref 3.5–5.2)
Sodium: 141 mmol/L (ref 134–144)
Total Protein: 7 g/dL (ref 6.0–8.5)

## 2020-09-21 LAB — PHENYTOIN LEVEL, TOTAL: Phenytoin (Dilantin), Serum: 9.7 ug/mL — ABNORMAL LOW (ref 10.0–20.0)

## 2020-09-25 ENCOUNTER — Telehealth: Payer: Self-pay | Admitting: Adult Health

## 2020-09-25 NOTE — Telephone Encounter (Signed)
Ward Givens, NP  09/24/2020 2:50 PM EST     Labs results are unremarkable. Please call patient with results.    Left message for patient to call back for results.

## 2020-10-09 NOTE — Telephone Encounter (Signed)
Pt has returned the call to [facility] please call pt back

## 2020-10-11 NOTE — Telephone Encounter (Signed)
Spoke with patient. She verbalized understanding. Nothing further needed at time of call.  

## 2020-11-24 ENCOUNTER — Other Ambulatory Visit: Payer: Self-pay | Admitting: Adult Health

## 2021-02-07 ENCOUNTER — Other Ambulatory Visit: Payer: Self-pay

## 2021-04-24 ENCOUNTER — Other Ambulatory Visit: Payer: Self-pay | Admitting: Family Medicine

## 2021-04-24 DIAGNOSIS — Z1231 Encounter for screening mammogram for malignant neoplasm of breast: Secondary | ICD-10-CM

## 2021-05-02 DIAGNOSIS — M25561 Pain in right knee: Secondary | ICD-10-CM | POA: Diagnosis not present

## 2021-05-02 DIAGNOSIS — W57XXXA Bitten or stung by nonvenomous insect and other nonvenomous arthropods, initial encounter: Secondary | ICD-10-CM | POA: Diagnosis not present

## 2021-06-18 ENCOUNTER — Ambulatory Visit: Payer: BC Managed Care – PPO

## 2021-06-28 ENCOUNTER — Ambulatory Visit
Admission: RE | Admit: 2021-06-28 | Discharge: 2021-06-28 | Disposition: A | Discharge status: Home / Self Care | Payer: Medicare HMO | Source: Ambulatory Visit | Attending: Family Medicine | Admitting: Family Medicine

## 2021-06-28 ENCOUNTER — Other Ambulatory Visit: Payer: Self-pay

## 2021-06-28 ENCOUNTER — Other Ambulatory Visit: Payer: Self-pay | Admitting: Family Medicine

## 2021-06-28 DIAGNOSIS — Z1231 Encounter for screening mammogram for malignant neoplasm of breast: Secondary | ICD-10-CM

## 2021-06-28 DIAGNOSIS — N63 Unspecified lump in unspecified breast: Secondary | ICD-10-CM

## 2021-06-29 ENCOUNTER — Other Ambulatory Visit: Payer: Self-pay | Admitting: Family Medicine

## 2021-06-29 DIAGNOSIS — Z23 Encounter for immunization: Secondary | ICD-10-CM | POA: Diagnosis not present

## 2021-06-29 DIAGNOSIS — M8580 Other specified disorders of bone density and structure, unspecified site: Secondary | ICD-10-CM

## 2021-06-29 DIAGNOSIS — Z0000 Encounter for general adult medical examination without abnormal findings: Secondary | ICD-10-CM | POA: Diagnosis not present

## 2021-07-24 ENCOUNTER — Ambulatory Visit
Admission: RE | Admit: 2021-07-24 | Discharge: 2021-07-24 | Disposition: A | Discharge status: Home / Self Care | Payer: Medicare HMO | Source: Ambulatory Visit | Attending: Family Medicine | Admitting: Family Medicine

## 2021-07-24 ENCOUNTER — Ambulatory Visit
Admission: RE | Admit: 2021-07-24 | Discharge: 2021-07-24 | Disposition: A | Discharge status: Home / Self Care | Payer: Medicare Other | Source: Ambulatory Visit | Attending: Family Medicine | Admitting: Family Medicine

## 2021-07-24 ENCOUNTER — Other Ambulatory Visit: Payer: Self-pay

## 2021-07-24 DIAGNOSIS — N63 Unspecified lump in unspecified breast: Secondary | ICD-10-CM

## 2021-07-24 DIAGNOSIS — R922 Inconclusive mammogram: Secondary | ICD-10-CM | POA: Diagnosis not present

## 2021-09-03 ENCOUNTER — Ambulatory Visit: Payer: Medicare HMO | Admitting: Adult Health

## 2021-09-03 ENCOUNTER — Encounter: Payer: Self-pay | Admitting: Adult Health

## 2021-12-17 ENCOUNTER — Other Ambulatory Visit: Payer: Medicare HMO

## 2022-05-14 ENCOUNTER — Other Ambulatory Visit: Payer: Medicare (Managed Care)

## 2022-05-22 ENCOUNTER — Ambulatory Visit
Admission: RE | Admit: 2022-05-22 | Discharge: 2022-05-22 | Disposition: A | Discharge status: Home / Self Care | Payer: Medicare (Managed Care) | Source: Ambulatory Visit | Attending: Family Medicine | Admitting: Family Medicine

## 2022-05-22 DIAGNOSIS — M8588 Other specified disorders of bone density and structure, other site: Secondary | ICD-10-CM | POA: Diagnosis not present

## 2022-05-22 DIAGNOSIS — Z780 Asymptomatic menopausal state: Secondary | ICD-10-CM | POA: Diagnosis not present

## 2022-05-22 DIAGNOSIS — M8580 Other specified disorders of bone density and structure, unspecified site: Secondary | ICD-10-CM

## 2022-05-22 DIAGNOSIS — M810 Age-related osteoporosis without current pathological fracture: Secondary | ICD-10-CM | POA: Diagnosis not present

## 2023-07-24 IMAGING — US US BREAST*L* LIMITED INC AXILLA
1 series · 4 of 4 positions shown · non-contrast
Comparison: Previous exam(s).

CLINICAL DATA: 85-year-old female status post malignant left
lumpectomy presents with a palpable lump.

EXAM:
DIGITAL DIAGNOSTIC BILATERAL MAMMOGRAM WITH TOMOSYNTHESIS AND CAD;
ULTRASOUND LEFT BREAST LIMITED
TECHNIQUE: Bilateral digital diagnostic mammography and breast tomosynthesis
was performed. The images were evaluated with computer-aided
detection.; Targeted ultrasound examination of the left breast was
performed.

[Series 1: us breast*left* limited inc axilla · 0.07mm/px · 4 of 4 slices shown]
[im 1/4]
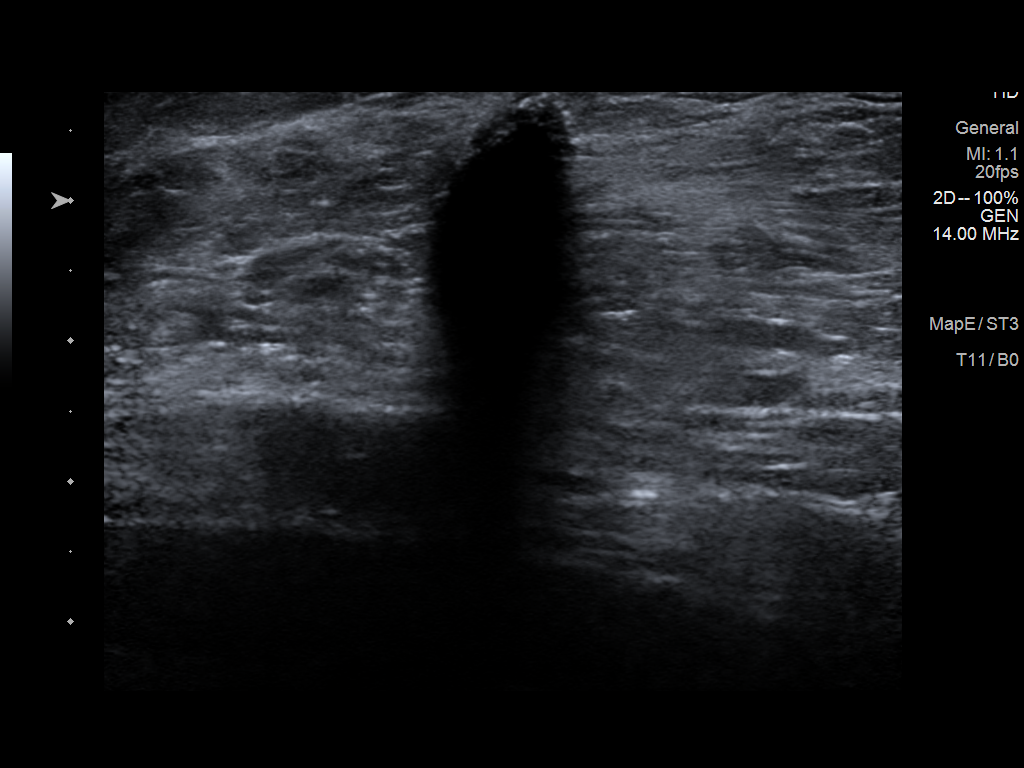
[im 2/4]
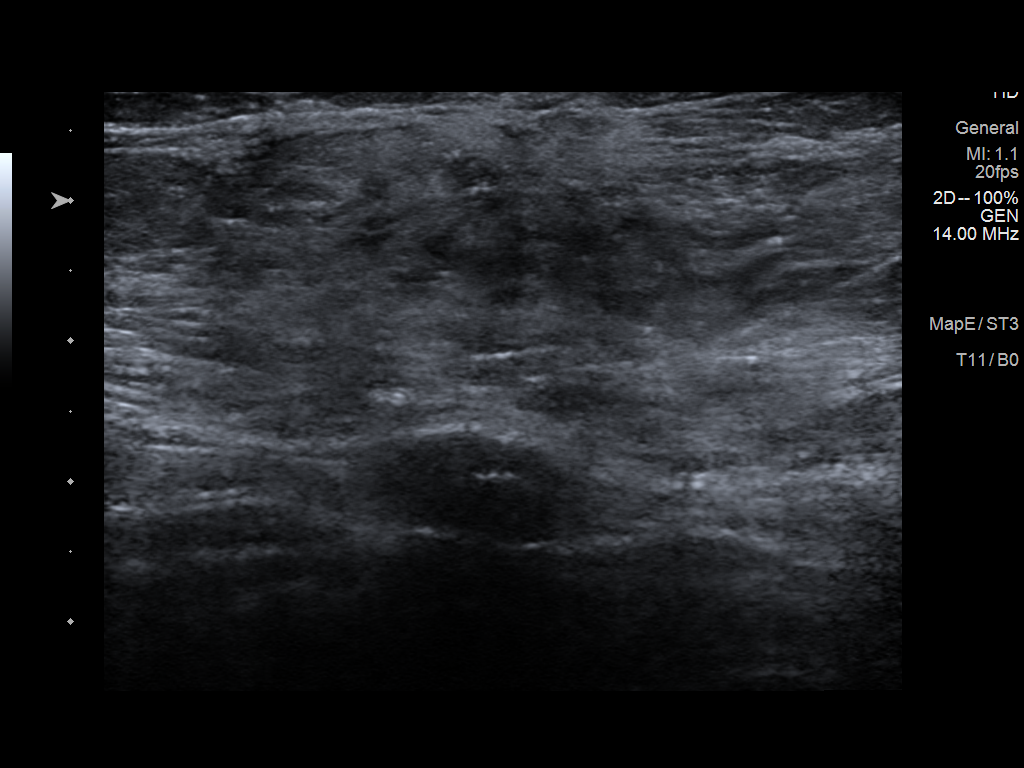
[im 3/4]
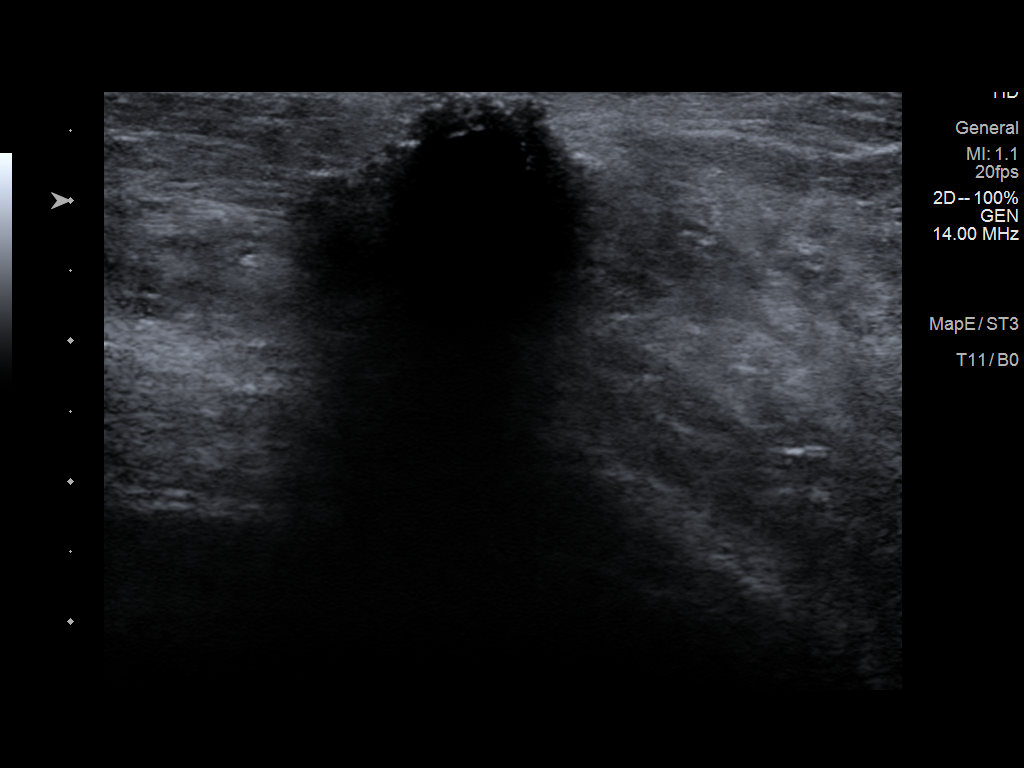
[im 4/4]
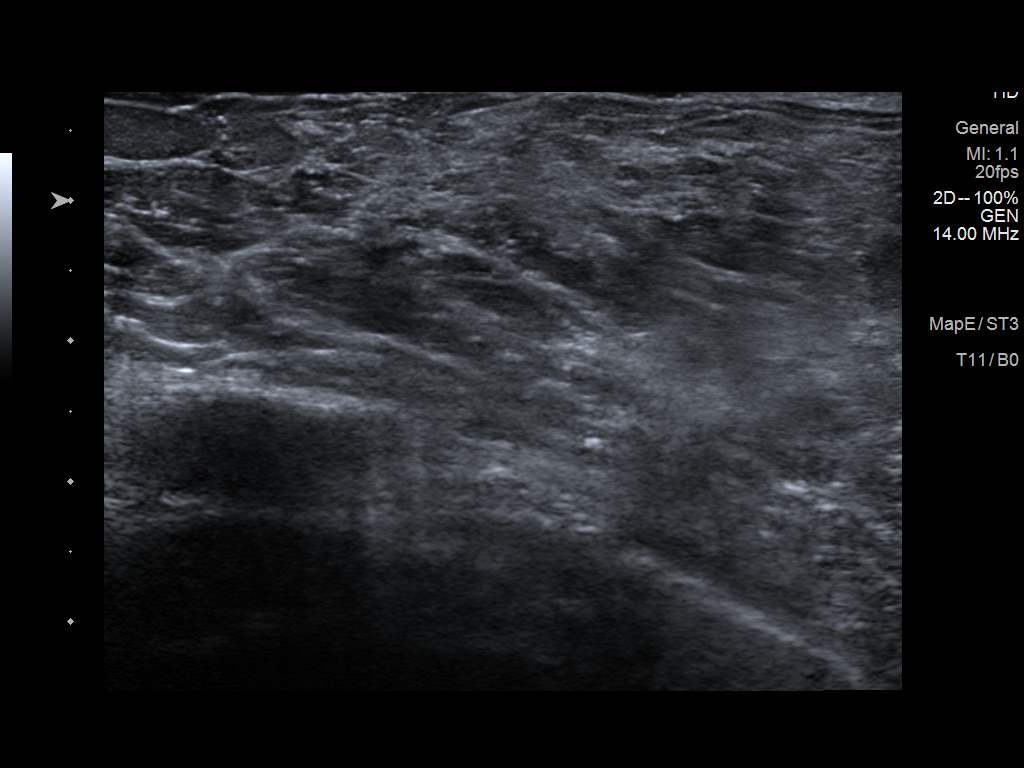

[4 of 4 positions shown; findings below may reference images not displayed]

ACR Breast Density Category b: There are scattered areas of
fibroglandular density.
FINDINGS: A radiopaque BB was placed at the site of the patient's palpable
lump over the left lumpectomy bed. A large, dystrophic calcification
is seen just deep to the radiopaque BB. No additional suspicious
findings in either breast.

Targeted ultrasound is performed, showing a large shadowing
calcification deep to the radiopaque BB. Otherwise, no suspicious
findings at the site of the patient's palpable lump.
IMPRESSION: 1. Left breast palpable lump corresponding with a large dystrophic
calcification within the patient's prior lumpectomy bed. No
additional suspicious findings.
2. No mammographic evidence of malignancy on the right.

RECOMMENDATION:
Screening mammogram in one year.(Code:WZ-3-OP1)

I have discussed the findings and recommendations with the patient.
If applicable, a reminder letter will be sent to the patient
regarding the next appointment.

BI-RADS CATEGORY  2: Benign.

## 2023-07-24 IMAGING — MG DIGITAL DIAGNOSTIC BILAT W/ TOMO W/ CAD
6 of 10 series · 6 of 30 positions shown · non-contrast
Comparison: Previous exam(s).

CLINICAL DATA: 85-year-old female status post malignant left
lumpectomy presents with a palpable lump.

EXAM:
DIGITAL DIAGNOSTIC BILATERAL MAMMOGRAM WITH TOMOSYNTHESIS AND CAD;
ULTRASOUND LEFT BREAST LIMITED
TECHNIQUE: Bilateral digital diagnostic mammography and breast tomosynthesis
was performed. The images were evaluated with computer-aided
detection.; Targeted ultrasound examination of the left breast was
performed.

[R CC synth-2D]
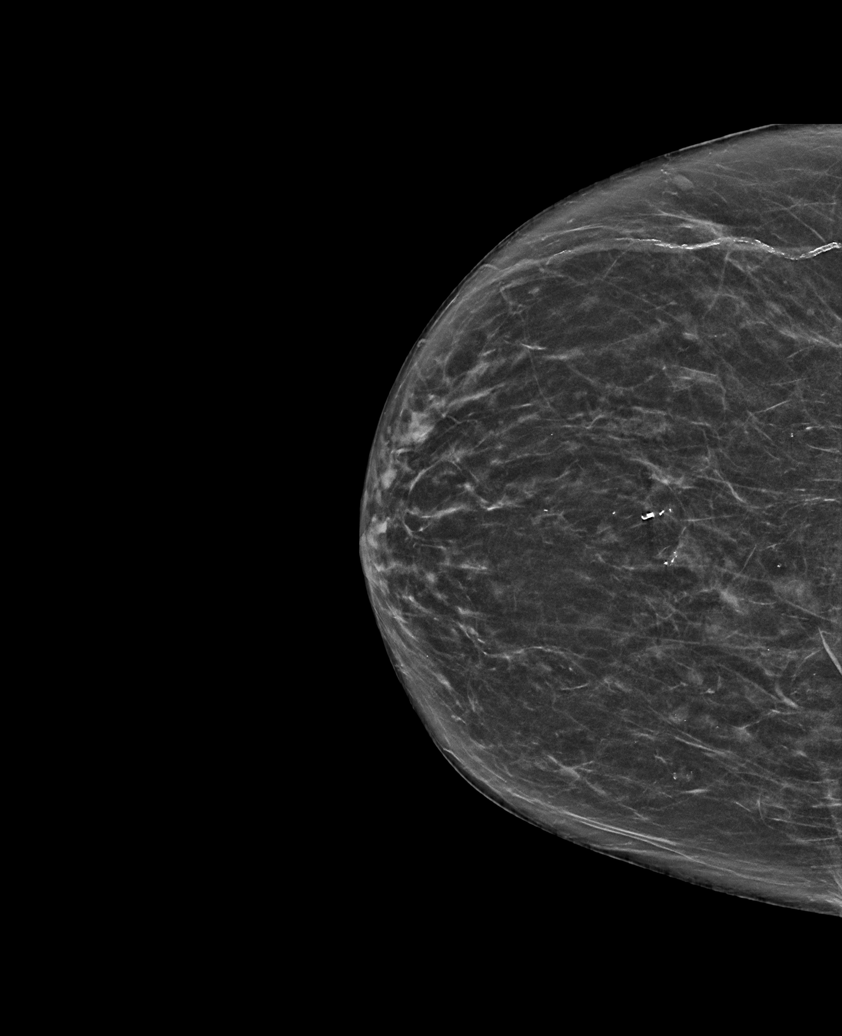

[R MLO synth-2D]
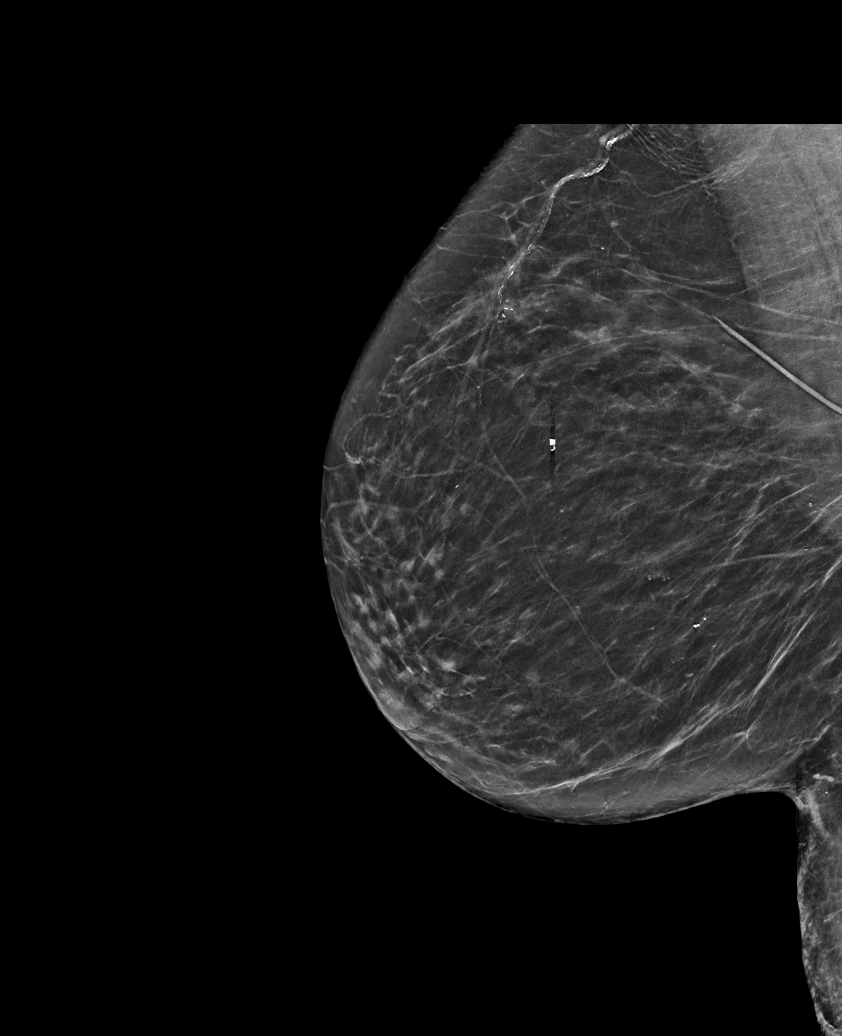

[L MLO synth-2D]
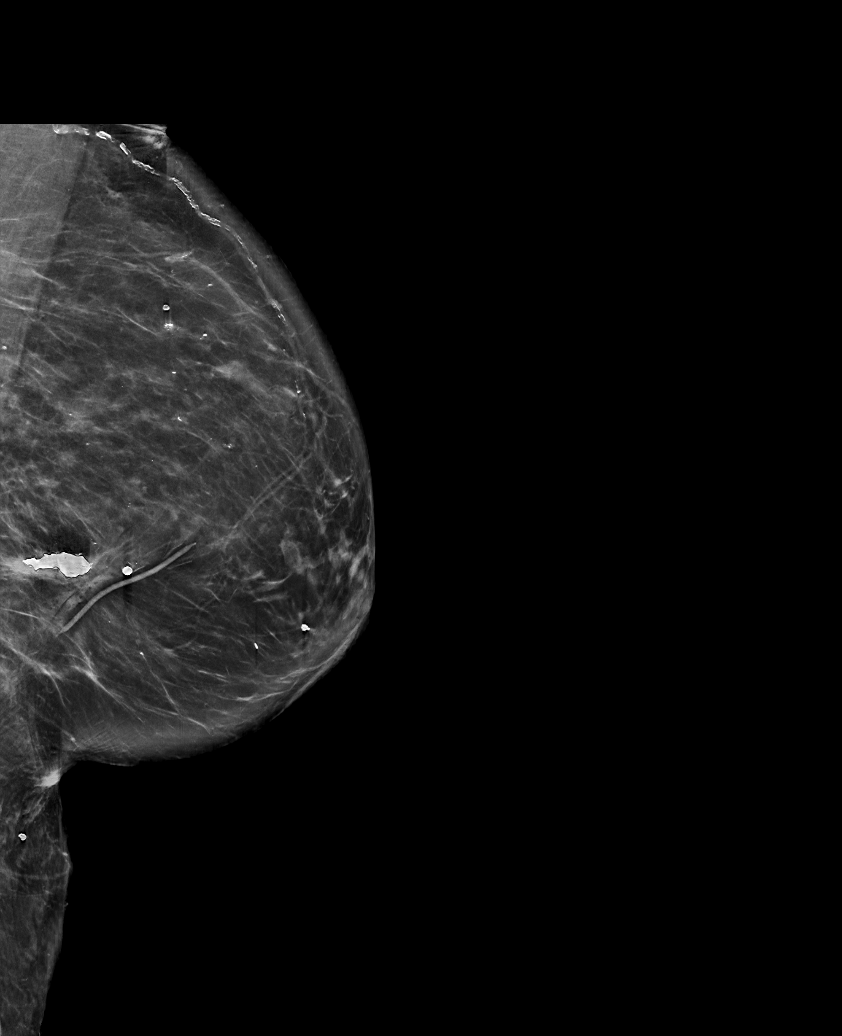

[L TAN synth-2D]
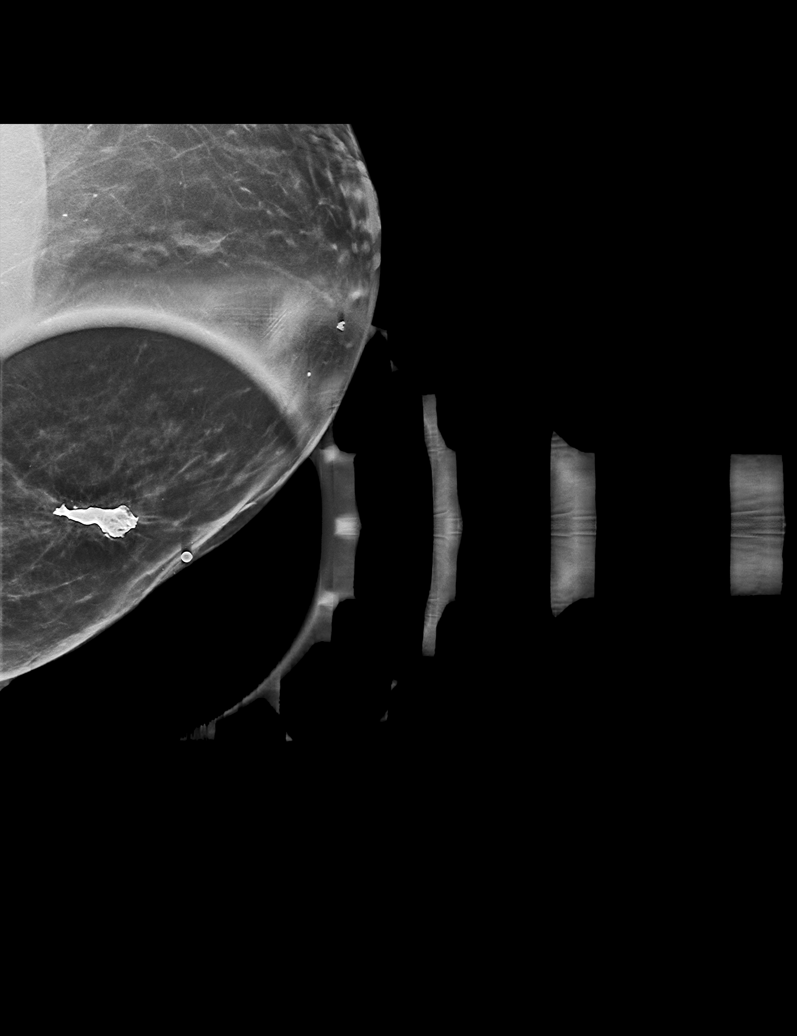

[L CC synth-2D]
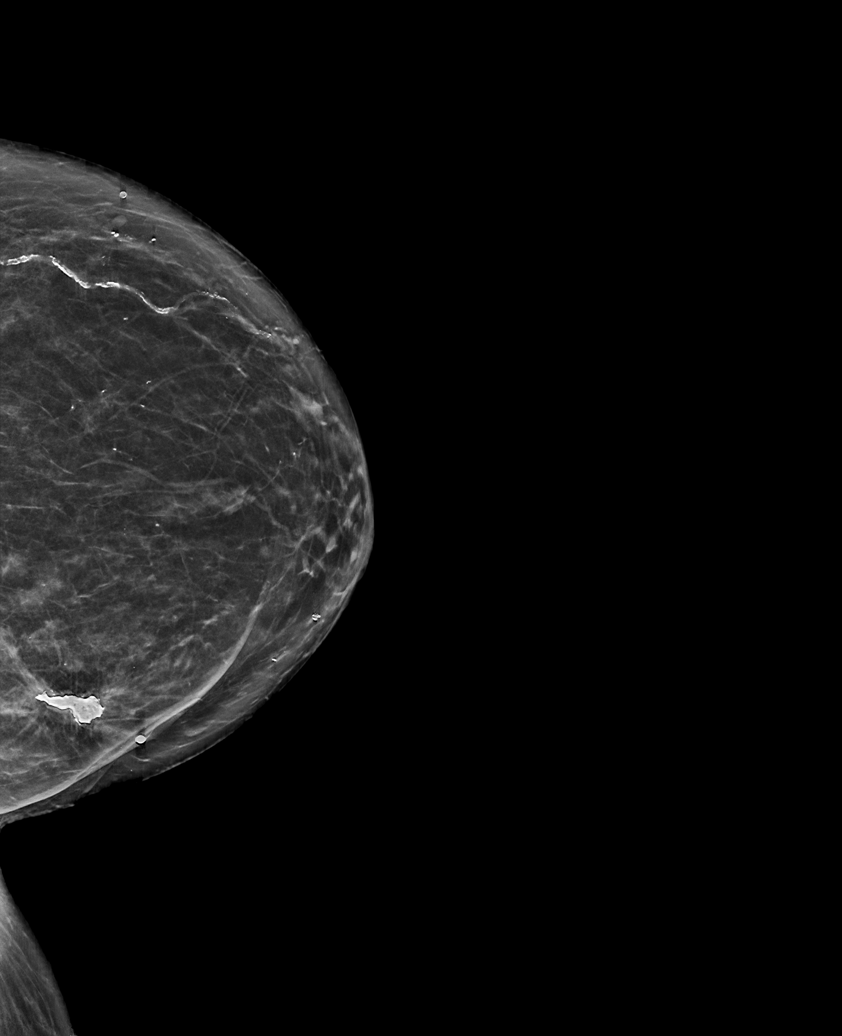

[R CC tomo · tomo slice 29/57.0]
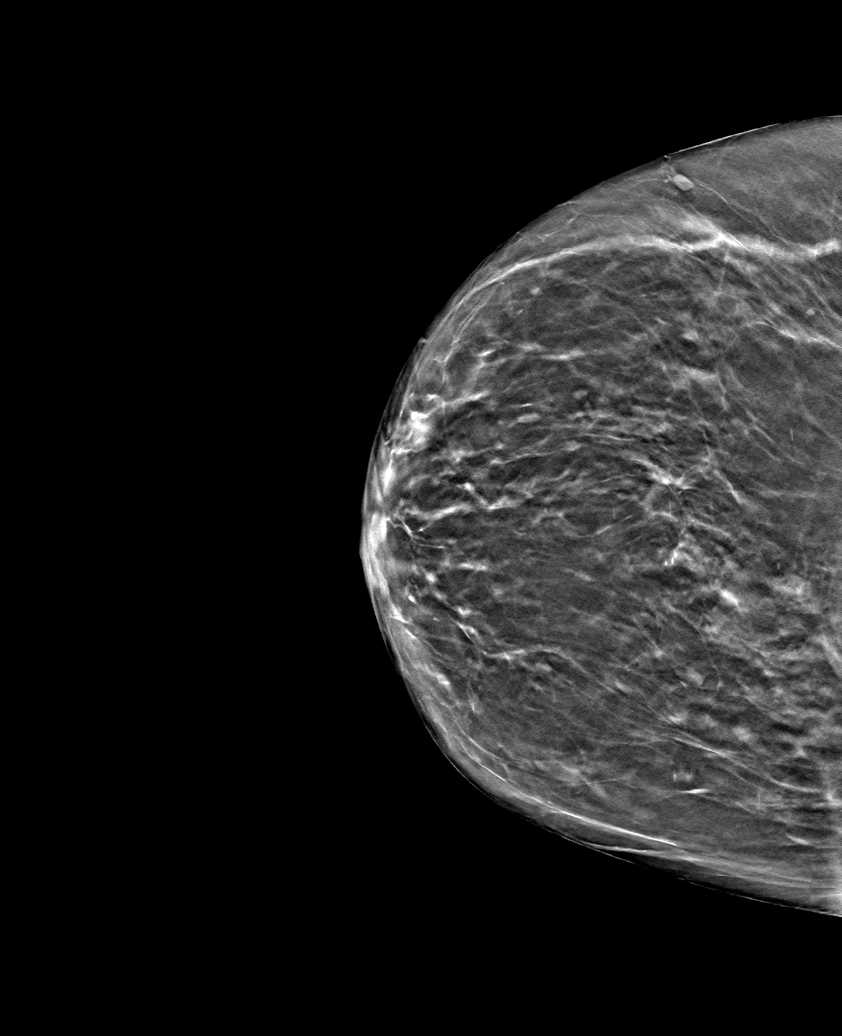

[6 of 30 positions shown; findings below may reference images not displayed]

ACR Breast Density Category b: There are scattered areas of
fibroglandular density.
FINDINGS: A radiopaque BB was placed at the site of the patient's palpable
lump over the left lumpectomy bed. A large, dystrophic calcification
is seen just deep to the radiopaque BB. No additional suspicious
findings in either breast.

Targeted ultrasound is performed, showing a large shadowing
calcification deep to the radiopaque BB. Otherwise, no suspicious
findings at the site of the patient's palpable lump.
IMPRESSION: 1. Left breast palpable lump corresponding with a large dystrophic
calcification within the patient's prior lumpectomy bed. No
additional suspicious findings.
2. No mammographic evidence of malignancy on the right.

RECOMMENDATION:
Screening mammogram in one year.(Code:WZ-3-OP1)

I have discussed the findings and recommendations with the patient.
If applicable, a reminder letter will be sent to the patient
regarding the next appointment.

BI-RADS CATEGORY  2: Benign.
